# Patient Record
Sex: Female | Born: 1985
Health system: Southern US, Community
[De-identification: ages and names within clinical notes are randomized; demographics above are authoritative.]

## PROBLEM LIST (undated history)

## (undated) DIAGNOSIS — N200 Calculus of kidney: Secondary | ICD-10-CM

## (undated) DIAGNOSIS — R002 Palpitations: Secondary | ICD-10-CM

## (undated) DIAGNOSIS — F419 Anxiety disorder, unspecified: Secondary | ICD-10-CM

## (undated) DIAGNOSIS — E878 Other disorders of electrolyte and fluid balance, not elsewhere classified: Secondary | ICD-10-CM

## (undated) HISTORY — PX: WISDOM TOOTH EXTRACTION: SHX21

## (undated) HISTORY — DX: Palpitations: R00.2

## (undated) HISTORY — PX: LITHOTRIPSY: SUR834

---
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
## 2004-05-27 ENCOUNTER — Other Ambulatory Visit: Admission: RE | Admit: 2004-05-27 | Discharge: 2004-05-27 | Payer: Self-pay | Admitting: Obstetrics and Gynecology

## 2004-08-19 ENCOUNTER — Other Ambulatory Visit: Admission: RE | Admit: 2004-08-19 | Discharge: 2004-08-19 | Payer: Self-pay | Admitting: Obstetrics and Gynecology

## 2005-01-08 ENCOUNTER — Other Ambulatory Visit: Admission: RE | Admit: 2005-01-08 | Discharge: 2005-01-08 | Payer: Self-pay | Admitting: Obstetrics and Gynecology

## 2005-07-21 ENCOUNTER — Other Ambulatory Visit: Admission: RE | Admit: 2005-07-21 | Discharge: 2005-07-21 | Payer: Self-pay | Admitting: Obstetrics and Gynecology

## 2006-01-13 ENCOUNTER — Other Ambulatory Visit: Admission: RE | Admit: 2006-01-13 | Discharge: 2006-01-13 | Payer: Self-pay | Admitting: Obstetrics and Gynecology

## 2006-05-13 ENCOUNTER — Encounter: Admission: RE | Admit: 2006-05-13 | Discharge: 2006-05-13 | Payer: Self-pay | Admitting: Obstetrics and Gynecology

## 2008-03-20 ENCOUNTER — Emergency Department (HOSPITAL_COMMUNITY): Admission: EM | Admit: 2008-03-20 | Discharge: 2008-03-20 | Payer: Self-pay | Admitting: Emergency Medicine

## 2010-07-23 ENCOUNTER — Emergency Department (HOSPITAL_COMMUNITY): Admission: EM | Admit: 2010-07-23 | Discharge: 2010-07-23 | Payer: Self-pay | Admitting: Emergency Medicine

## 2010-07-31 ENCOUNTER — Ambulatory Visit (HOSPITAL_COMMUNITY): Admission: RE | Admit: 2010-07-31 | Discharge: 2010-07-31 | Payer: Self-pay | Admitting: Urology

## 2011-02-05 LAB — PREGNANCY, URINE: Preg Test, Ur: NEGATIVE

## 2011-02-06 LAB — DIFFERENTIAL
Basophils Relative: 1 % (ref 0–1)
Eosinophils Absolute: 0.2 10*3/uL (ref 0.0–0.7)
Lymphs Abs: 4.4 10*3/uL — ABNORMAL HIGH (ref 0.7–4.0)
Monocytes Absolute: 0.9 10*3/uL (ref 0.1–1.0)
Monocytes Relative: 5 % (ref 3–12)
Neutrophils Relative %: 68 % (ref 43–77)

## 2011-02-06 LAB — URINE MICROSCOPIC-ADD ON

## 2011-02-06 LAB — CBC
HCT: 39.5 % (ref 36.0–46.0)
Hemoglobin: 13.3 g/dL (ref 12.0–15.0)
MCH: 31.3 pg (ref 26.0–34.0)
MCHC: 33.7 g/dL (ref 30.0–36.0)
RBC: 4.25 MIL/uL (ref 3.87–5.11)

## 2011-02-06 LAB — COMPREHENSIVE METABOLIC PANEL
Alkaline Phosphatase: 65 U/L (ref 39–117)
BUN: 8 mg/dL (ref 6–23)
Chloride: 105 mEq/L (ref 96–112)
Creatinine, Ser: 0.83 mg/dL (ref 0.4–1.2)
Glucose, Bld: 103 mg/dL — ABNORMAL HIGH (ref 70–99)
Potassium: 3.4 mEq/L — ABNORMAL LOW (ref 3.5–5.1)
Total Bilirubin: 0.9 mg/dL (ref 0.3–1.2)

## 2011-02-06 LAB — URINALYSIS, ROUTINE W REFLEX MICROSCOPIC
Bilirubin Urine: NEGATIVE
Glucose, UA: NEGATIVE mg/dL
Ketones, ur: NEGATIVE mg/dL
Specific Gravity, Urine: 1.021 (ref 1.005–1.030)
pH: 5.5 (ref 5.0–8.0)

## 2011-02-06 LAB — LIPASE, BLOOD: Lipase: 21 U/L (ref 11–59)

## 2011-02-06 LAB — URINE CULTURE: Culture  Setup Time: 201108311353

## 2011-02-06 LAB — POCT I-STAT, CHEM 8
Calcium, Ion: 1.15 mmol/L (ref 1.12–1.32)
Chloride: 105 mEq/L (ref 96–112)
Creatinine, Ser: 0.8 mg/dL (ref 0.4–1.2)
Glucose, Bld: 106 mg/dL — ABNORMAL HIGH (ref 70–99)
Hemoglobin: 14.3 g/dL (ref 12.0–15.0)
Potassium: 3.5 mEq/L (ref 3.5–5.1)

## 2011-02-06 LAB — POCT PREGNANCY, URINE: Preg Test, Ur: NEGATIVE

## 2012-03-11 ENCOUNTER — Emergency Department (HOSPITAL_BASED_OUTPATIENT_CLINIC_OR_DEPARTMENT_OTHER)
Admission: EM | Admit: 2012-03-11 | Discharge: 2012-03-11 | Disposition: A | Discharge status: Home / Self Care | Payer: No Typology Code available for payment source | Attending: Emergency Medicine | Admitting: Emergency Medicine

## 2012-03-11 ENCOUNTER — Emergency Department (INDEPENDENT_AMBULATORY_CARE_PROVIDER_SITE_OTHER): Payer: No Typology Code available for payment source

## 2012-03-11 ENCOUNTER — Encounter (HOSPITAL_BASED_OUTPATIENT_CLINIC_OR_DEPARTMENT_OTHER): Payer: Self-pay | Admitting: *Deleted

## 2012-03-11 DIAGNOSIS — M542 Cervicalgia: Secondary | ICD-10-CM | POA: Insufficient documentation

## 2012-03-11 DIAGNOSIS — M25619 Stiffness of unspecified shoulder, not elsewhere classified: Secondary | ICD-10-CM | POA: Insufficient documentation

## 2012-03-11 DIAGNOSIS — S139XXA Sprain of joints and ligaments of unspecified parts of neck, initial encounter: Secondary | ICD-10-CM | POA: Insufficient documentation

## 2012-03-11 DIAGNOSIS — S161XXA Strain of muscle, fascia and tendon at neck level, initial encounter: Secondary | ICD-10-CM

## 2012-03-11 DIAGNOSIS — F17200 Nicotine dependence, unspecified, uncomplicated: Secondary | ICD-10-CM | POA: Insufficient documentation

## 2012-03-11 DIAGNOSIS — M25519 Pain in unspecified shoulder: Secondary | ICD-10-CM

## 2012-03-11 HISTORY — DX: Other disorders of electrolyte and fluid balance, not elsewhere classified: E87.8

## 2012-03-11 HISTORY — DX: Calculus of kidney: N20.0

## 2012-03-11 HISTORY — DX: Anxiety disorder, unspecified: F41.9

## 2012-03-11 MED ORDER — TRAMADOL HCL 50 MG PO TABS: 50.0000 mg | ORAL_TABLET | Freq: Four times a day (QID) | ORAL | Status: AC | PRN

## 2012-03-11 MED ORDER — ORPHENADRINE CITRATE ER 100 MG PO TB12: 100.0000 mg | ORAL_TABLET | Freq: Two times a day (BID) | ORAL | Status: AC
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
## 2014-10-26 ENCOUNTER — Emergency Department (HOSPITAL_COMMUNITY): Payer: PRIVATE HEALTH INSURANCE

## 2014-10-26 ENCOUNTER — Encounter (HOSPITAL_COMMUNITY): Payer: Self-pay | Admitting: Emergency Medicine

## 2014-10-26 ENCOUNTER — Emergency Department (HOSPITAL_COMMUNITY)
Admission: EM | Admit: 2014-10-26 | Discharge: 2014-10-26 | Disposition: A | Discharge status: Home / Self Care | Payer: PRIVATE HEALTH INSURANCE | Attending: Emergency Medicine | Admitting: Emergency Medicine

## 2014-10-26 DIAGNOSIS — W2209XA Striking against other stationary object, initial encounter: Secondary | ICD-10-CM | POA: Diagnosis not present

## 2014-10-26 DIAGNOSIS — Y9389 Activity, other specified: Secondary | ICD-10-CM | POA: Diagnosis not present

## 2014-10-26 DIAGNOSIS — S90121A Contusion of right lesser toe(s) without damage to nail, initial encounter: Secondary | ICD-10-CM | POA: Diagnosis not present

## 2014-10-26 DIAGNOSIS — Z8639 Personal history of other endocrine, nutritional and metabolic disease: Secondary | ICD-10-CM | POA: Diagnosis not present

## 2014-10-26 DIAGNOSIS — S99921A Unspecified injury of right foot, initial encounter: Secondary | ICD-10-CM | POA: Diagnosis present

## 2014-10-26 DIAGNOSIS — Z87442 Personal history of urinary calculi: Secondary | ICD-10-CM | POA: Diagnosis not present

## 2014-10-26 DIAGNOSIS — Z720 Tobacco use: Secondary | ICD-10-CM | POA: Diagnosis not present

## 2014-10-26 DIAGNOSIS — Z79899 Other long term (current) drug therapy: Secondary | ICD-10-CM | POA: Insufficient documentation

## 2014-10-26 DIAGNOSIS — G43909 Migraine, unspecified, not intractable, without status migrainosus: Secondary | ICD-10-CM | POA: Insufficient documentation

## 2014-10-26 DIAGNOSIS — Y9289 Other specified places as the place of occurrence of the external cause: Secondary | ICD-10-CM | POA: Diagnosis not present

## 2014-10-26 DIAGNOSIS — M79671 Pain in right foot: Secondary | ICD-10-CM

## 2014-10-26 DIAGNOSIS — Y998 Other external cause status: Secondary | ICD-10-CM | POA: Diagnosis not present

## 2014-10-26 DIAGNOSIS — F419 Anxiety disorder, unspecified: Secondary | ICD-10-CM | POA: Diagnosis not present
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
## 2014-10-26 NOTE — Discharge Instructions (Signed)

## 2014-10-26 NOTE — ED Provider Notes (Signed)
CSN: [id number]     Arrival date & time 10/26/14  1715 History   First MD Initiated Contact with Patient 10/26/14 1722     Chief Complaint  Patient presents with  . Foot Pain     (Consider location/radiation/quality/duration/timing/severity/associated sxs/prior Treatment) Patient is a 28 y.o. female presenting with lower extremity pain. The history is provided by the patient. No language interpreter was used.  Foot Pain This is a new problem. The current episode started today. The problem occurs constantly. The problem has been unchanged. Associated symptoms include arthralgias. The symptoms are aggravated by walking. She has tried nothing for the symptoms.    Past Medical History  Diagnosis Date  . Kidney stone   . Anxiety   . Migraine   . Hyperchloremia    Past Surgical History  Procedure Laterality Date  . Wisdom tooth extraction    . Lithotripsy     No family history on file. History  Substance Use Topics  . Smoking status: Current Every Day Smoker -- 0.50 packs/day    Types: Cigarettes  . Smokeless tobacco: Not on file  . Alcohol Use: No     Comment: rarely   OB History    No data available     Review of Systems  Musculoskeletal: Positive for arthralgias.  All other systems reviewed and are negative.     Allergies  Bactrim  Home Medications   Prior to Admission medications   Medication Sig Start Date End Date Taking? Authorizing Provider  ALPRAZolam Prudy Feeler(XANAX) 0.5 MG tablet Take 0.5 mg by mouth at bedtime as needed.    Historical Provider, MD  aspirin-acetaminophen-caffeine (EXCEDRIN MIGRAINE) [phone] [id number] MG per tablet Take 1 tablet by mouth every 6 (six) hours as needed.    Historical Provider, MD  Multiple Vitamin (MULTIVITAMIN) capsule Take 1 capsule by mouth daily.    Historical Provider, MD  norethindrone-ethinyl estradiol (JUNEL FE,GILDESS FE,LOESTRIN FE) 1-20 MG-MCG tablet Take 1 tablet by mouth daily.    Historical Provider, MD   LMP 10/04/2014  (Exact Date) Physical Exam  Constitutional: She is oriented to person, place, and time. She appears well-developed and well-nourished.  HENT:  Head: Normocephalic.  Eyes: Pupils are equal, round, and reactive to light.  Neck: Neck supple.  Cardiovascular: Normal rate and regular rhythm.   Pulmonary/Chest: Effort normal and breath sounds normal.  Musculoskeletal: She exhibits tenderness.  Neurological: She is alert and oriented to person, place, and time.  Skin: Skin is warm and dry.  Psychiatric: She has a normal mood and affect.  Nursing note and vitals reviewed.   ED Course  Procedures (including critical care time) Labs Review Labs Reviewed - No data to display  Imaging Review No results found.   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  Bariatric bed ran over the toes of her right foot.  Patient self-extricated from under bed, leaving shoe and sock behind.  Currently having pain to the dorsal aspect of her 2nd and 3rd toes.  No deformity or obvious external bruising noted. MDM   Final diagnoses:  Foot pain, right    Contusion to 2nd and 3rd toes of right foot.    Jimmye Normanavid John Darthula Desa, NP 10/26/14 2111  Gerhard Munchobert Lockwood, MD 10/26/14 [phone]

## 2015-08-07 ENCOUNTER — Other Ambulatory Visit: Payer: Self-pay | Admitting: Internal Medicine

## 2015-08-07 ENCOUNTER — Other Ambulatory Visit (HOSPITAL_COMMUNITY): Payer: Self-pay | Admitting: Internal Medicine

## 2015-08-07 DIAGNOSIS — R7989 Other specified abnormal findings of blood chemistry: Secondary | ICD-10-CM

## 2015-08-07 DIAGNOSIS — R000 Tachycardia, unspecified: Secondary | ICD-10-CM

## 2015-08-07 DIAGNOSIS — R0600 Dyspnea, unspecified: Secondary | ICD-10-CM

## 2015-08-07 DIAGNOSIS — R0789 Other chest pain: Secondary | ICD-10-CM

## 2015-08-08 ENCOUNTER — Ambulatory Visit (HOSPITAL_COMMUNITY)
Admission: RE | Admit: 2015-08-08 | Discharge: 2015-08-08 | Disposition: A | Discharge status: Home / Self Care | Payer: 59 | Source: Ambulatory Visit | Attending: Internal Medicine | Admitting: Internal Medicine

## 2015-08-08 ENCOUNTER — Encounter (HOSPITAL_COMMUNITY): Payer: Self-pay

## 2015-08-08 DIAGNOSIS — R0602 Shortness of breath: Secondary | ICD-10-CM | POA: Insufficient documentation

## 2015-08-08 DIAGNOSIS — R7989 Other specified abnormal findings of blood chemistry: Secondary | ICD-10-CM

## 2015-08-08 DIAGNOSIS — R000 Tachycardia, unspecified: Secondary | ICD-10-CM | POA: Diagnosis not present

## 2015-08-08 DIAGNOSIS — R0600 Dyspnea, unspecified: Secondary | ICD-10-CM

## 2015-08-08 DIAGNOSIS — R0789 Other chest pain: Secondary | ICD-10-CM | POA: Insufficient documentation

## 2015-08-08 MED ORDER — IOHEXOL 350 MG/ML SOLN
100.0000 mL | Freq: Once | INTRAVENOUS | Status: AC | PRN
  Administered 2015-08-08: 100 mL via INTRAVENOUS

## 2015-08-30 ENCOUNTER — Telehealth: Payer: Self-pay | Admitting: Cardiovascular Disease

## 2015-08-30 NOTE — Telephone Encounter (Signed)
ERROR

## 2015-09-03 ENCOUNTER — Ambulatory Visit (INDEPENDENT_AMBULATORY_CARE_PROVIDER_SITE_OTHER): Payer: 59 | Admitting: Cardiovascular Disease

## 2015-09-03 ENCOUNTER — Encounter: Payer: Self-pay | Admitting: Cardiovascular Disease

## 2015-09-03 VITALS — BP 110/58 | HR 88 | Ht 65.0 in | Wt 130.8 lb

## 2015-09-03 DIAGNOSIS — R002 Palpitations: Secondary | ICD-10-CM | POA: Diagnosis not present

## 2015-09-03 DIAGNOSIS — R0602 Shortness of breath: Secondary | ICD-10-CM

## 2015-09-03 HISTORY — DX: Palpitations: R00.2

## 2015-09-03 NOTE — Progress Notes (Addendum)
Cardiology Office Note Date:  10/23/2016   ID:  Elizabeth Young, DOB Feb 12, 1986, MRN [id number]  PCP:  Pearla Dubonnet, MD  Cardiologist:  Tonny Bollman, MD    Chief Complaint  Patient presents with  . Palpitations    History of Present Illness: Elizabeth Young is a 29 y.o. female who presents for evaluation of shortness of breath. She has a long hx of heart palpitations, but these have been more frequent lately. Also has had shortness of breath, but feels like she's had muscle tightness in the chest wall, neck, and back. Feels like she 'can't get a deep breath.' Has had some right-sided chest pain into the abdomen. No exertional symptoms. In fact, states she feels better when she walks or does physical activity.   She's been under a lot of stress and admits to significant anxiety. She is undergoing fertility treatment and currently is taking Clomid.   She has no personal history or CAD or cardiac problems, nor is there a family history of CAD. She has undergone a CTA of the chest to evaluate for PE and this was normal.      Past Medical History:  Diagnosis Date  . Anxiety   . Heart palpitations 09/03/2015  . Hyperchloremia   . Kidney stone   . Migraine     Past Surgical History:  Procedure Laterality Date  . LITHOTRIPSY    . WISDOM TOOTH EXTRACTION      Current Outpatient Prescriptions  Medication Sig Dispense Refill  . cetirizine (ZYRTEC) 10 MG tablet Take 10 mg by mouth as needed for allergies.    Marland Kitchen aspirin-acetaminophen-caffeine (EXCEDRIN MIGRAINE) 250-250-65 MG per tablet Take 1 tablet by mouth every 6 (six) hours as needed.    Marland Kitchen LORazepam (ATIVAN) 0.5 MG tablet Take 0.25 mg by mouth every 12 (twelve) hours as needed.   1  . Multiple Vitamin (MULTIVITAMIN) capsule Take 1 capsule by mouth daily.     No current facility-administered medications for this visit.     Allergies:   Bactrim [sulfamethoxazole-trimethoprim]   Social History:  The patient  reports  that she has quit smoking. Her smoking use included Cigarettes. She has never used smokeless tobacco. She reports that she does not drink alcohol or use drugs.   Family History:  The patient's family history includes Heart attack in her maternal grandfather and paternal grandmother.    ROS:  Please see the history of present illness.  Otherwise, review of systems is positive for cough, back pain, muscle pain, palpitations.  All other systems are reviewed and negative.    PHYSICAL EXAM: VS:  BP (!) 110/58   Pulse 88   Ht  (1.651 m)   Wt 130 lb 12.8 oz (59.3 kg)   LMP 08/03/2015   BMI 21.77 kg/m  , BMI Body mass index is 21.77 kg/m. GEN: Well nourished, well developed, in no acute distress HEENT: normal Neck: no JVD, no masses. No carotid bruits Cardiac: RRR without murmur or gallop                Respiratory:  clear to auscultation bilaterally, normal work of breathing GI: soft, nontender, nondistended, + BS MS: no deformity or atrophy Ext: no pretibial edema, pedal pulses 2+= bilaterally Skin: warm and dry, no rash Neuro:  Strength and sensation are intact Psych: euthymic mood, full affect  EKG:  EKG is not ordered today. The ekg from 08-07-2015 shows NSR, within normal limits  Recent Labs: No results found  for requested labs within last 8760 hours.   Lipid Panel  No results found for: CHOL, TRIG, HDL, CHOLHDL, VLDL, LDLCALC, LDLDIRECT    Wt Readings from Last 3 Encounters:  09/03/15 130 lb 12.8 oz (59.3 kg)  03/11/12 137 lb (62.1 kg)     ASSESSMENT AND PLAN: 29 yo woman with palpitations, shortness of breath, and atypical chest pain. I've recommended a 2D echo and Holter monitor to evaluate for any evidence of structural heart abnormality or rhythm disturbance. If these studies show no significant abnormalities, I would not be inclined for any further evaluation at this time. Will touch base with Jacinta once her studies are completed. She was reassured about her  cardiac exam and normal studies to date (CT scan, EKG).    Current medicines are reviewed with the patient today.  The patient does not have concerns regarding medicines.  Labs/ tests ordered today include:   Orders Placed This Encounter  Procedures  . Holter monitor - 24 hour  . Echocardiogram    Disposition:   FU as needed  Signed, Tonny Bollman, MD  10/23/2016 9:07 AM    [facility] [address] [place], [place], [place]  [address] Phone: [phone]; Fax: [phone]

## 2015-09-03 NOTE — Patient Instructions (Addendum)
Medication Instructions:  Your physician recommends that you continue on your current medications as directed. Please refer to the Current Medication list given to you today.  Labwork: No new orders.   Testing/Procedures: Your physician has requested that you have an echocardiogram. Echocardiography is a painless test that uses sound waves to create images of your heart. It provides your doctor with information about the size and shape of your heart and how well your heart's chambers and valves are working. This procedure takes approximately one hour. There are no restrictions for this procedure.  Your physician has recommended that you wear a 24 hour holter monitor. Holter monitors are medical devices that record the heart's electrical activity. Doctors most often use these monitors to diagnose arrhythmias. Arrhythmias are problems with the speed or rhythm of the heartbeat. The monitor is a small, portable device. You can wear one while you do your normal daily activities. This is usually used to diagnose what is causing palpitations/syncope (passing out).  Follow-Up: Your physician recommends that you schedule a follow-up appointment as needed with Dr Cooper.    Any Other Special Instructions Will Be Listed Below (If Applicable).   

## 2015-09-12 ENCOUNTER — Ambulatory Visit (HOSPITAL_COMMUNITY): Payer: 59 | Attending: Cardiology

## 2015-09-12 ENCOUNTER — Other Ambulatory Visit: Payer: Self-pay

## 2015-09-12 ENCOUNTER — Ambulatory Visit (INDEPENDENT_AMBULATORY_CARE_PROVIDER_SITE_OTHER): Payer: 59

## 2015-09-12 DIAGNOSIS — R0602 Shortness of breath: Secondary | ICD-10-CM | POA: Diagnosis not present

## 2015-09-12 DIAGNOSIS — R002 Palpitations: Secondary | ICD-10-CM | POA: Insufficient documentation

## 2015-09-12 DIAGNOSIS — I340 Nonrheumatic mitral (valve) insufficiency: Secondary | ICD-10-CM | POA: Diagnosis not present

## 2015-12-05 DIAGNOSIS — N979 Female infertility, unspecified: Secondary | ICD-10-CM | POA: Diagnosis not present

## 2015-12-05 MED FILL — OVIDREL 250 MCG/0.5 ML SYRG: 250 | 1 days supply | Qty: 1 | Fill #0

## 2015-12-06 DIAGNOSIS — N979 Female infertility, unspecified: Secondary | ICD-10-CM | POA: Diagnosis not present

## 2015-12-18 MED FILL — SERTRALINE HCL 50 MG TABLET: 50 | 30 days supply | Qty: 30 | Fill #1

## 2015-12-24 DIAGNOSIS — N912 Amenorrhea, unspecified: Secondary | ICD-10-CM | POA: Diagnosis not present

## 2016-01-20 MED FILL — CLOMIPHENE CITRATE 50 MG TA: 50 | 5 days supply | Qty: 10 | Fill #0

## 2016-01-23 MED FILL — SERTRALINE HCL 50 MG TABLET: 50 | 30 days supply | Qty: 30 | Fill #2

## 2016-01-30 DIAGNOSIS — N979 Female infertility, unspecified: Secondary | ICD-10-CM | POA: Diagnosis not present

## 2016-01-30 MED FILL — OVIDREL 250 MCG/0.5 ML SYRG: 250 | 1 days supply | Qty: 1 | Fill #0

## 2016-01-31 DIAGNOSIS — N979 Female infertility, unspecified: Secondary | ICD-10-CM | POA: Diagnosis not present

## 2016-02-18 MED FILL — SERTRALINE HCL 50 MG TABLET: 50 | 30 days supply | Qty: 30 | Fill #3

## 2016-02-25 DIAGNOSIS — N979 Female infertility, unspecified: Secondary | ICD-10-CM | POA: Diagnosis not present

## 2016-03-27 MED FILL — SERTRALINE HCL 50 MG TABLET: 50 | 30 days supply | Qty: 30 | Fill #4

## 2016-04-30 MED FILL — SERTRALINE HCL 50 MG TABLET: 50 | 30 days supply | Qty: 30 | Fill #5

## 2016-05-29 MED FILL — SERTRALINE HCL 50 MG TABLET: 50 | 30 days supply | Qty: 30 | Fill #0

## 2016-07-02 MED FILL — SERTRALINE HCL 50 MG TABLET: 50 | 30 days supply | Qty: 30 | Fill #1

## 2016-08-06 MED FILL — SERTRALINE HCL 50 MG TABLET: 50 | 30 days supply | Qty: 30 | Fill #2

## 2016-08-24 DIAGNOSIS — N911 Secondary amenorrhea: Secondary | ICD-10-CM | POA: Diagnosis not present

## 2016-08-27 ENCOUNTER — Telehealth: Payer: Self-pay | Admitting: Cardiovascular Disease

## 2016-08-27 NOTE — Telephone Encounter (Signed)
Received Amendment request from patient on August 27, 2016. Documents forwarded to Dr. Thad Rangerooper DAJ

## 2016-09-01 DIAGNOSIS — Z3A08 8 weeks gestation of pregnancy: Secondary | ICD-10-CM | POA: Diagnosis not present

## 2016-09-01 DIAGNOSIS — Z3401 Encounter for supervision of normal first pregnancy, first trimester: Secondary | ICD-10-CM | POA: Diagnosis not present

## 2016-09-01 DIAGNOSIS — O26841 Uterine size-date discrepancy, first trimester: Secondary | ICD-10-CM | POA: Diagnosis not present

## 2016-09-01 LAB — OB RESULTS CONSOLE RPR: RPR: NONREACTIVE

## 2016-09-01 LAB — OB RESULTS CONSOLE HIV ANTIBODY (ROUTINE TESTING): HIV: NONREACTIVE

## 2016-09-01 LAB — OB RESULTS CONSOLE HEPATITIS B SURFACE ANTIGEN: Hepatitis B Surface Ag: NEGATIVE

## 2016-09-01 LAB — OB RESULTS CONSOLE RUBELLA ANTIBODY, IGM: RUBELLA: IMMUNE

## 2016-09-01 LAB — OB RESULTS CONSOLE ANTIBODY SCREEN: Antibody Screen: NEGATIVE

## 2016-09-01 LAB — OB RESULTS CONSOLE ABO/RH: RH Type: POSITIVE

## 2016-09-07 MED FILL — SERTRALINE HCL 50 MG TABLET: 50 | 30 days supply | Qty: 30 | Fill #3

## 2016-09-21 DIAGNOSIS — Z3480 Encounter for supervision of other normal pregnancy, unspecified trimester: Secondary | ICD-10-CM | POA: Diagnosis not present

## 2016-09-21 DIAGNOSIS — Z3401 Encounter for supervision of normal first pregnancy, first trimester: Secondary | ICD-10-CM | POA: Diagnosis not present

## 2016-09-21 DIAGNOSIS — Z113 Encounter for screening for infections with a predominantly sexual mode of transmission: Secondary | ICD-10-CM | POA: Diagnosis not present

## 2016-09-21 LAB — OB RESULTS CONSOLE GC/CHLAMYDIA
CHLAMYDIA, DNA PROBE: NEGATIVE
GC PROBE AMP, GENITAL: NEGATIVE

## 2016-10-01 DIAGNOSIS — Z36 Encounter for antenatal screening for chromosomal anomalies: Secondary | ICD-10-CM | POA: Diagnosis not present

## 2016-10-01 DIAGNOSIS — Z3491 Encounter for supervision of normal pregnancy, unspecified, first trimester: Secondary | ICD-10-CM | POA: Diagnosis not present

## 2016-10-01 DIAGNOSIS — Z3682 Encounter for antenatal screening for nuchal translucency: Secondary | ICD-10-CM | POA: Diagnosis not present

## 2016-10-22 MED FILL — SERTRALINE HCL 50 MG TABLET: 50 | 30 days supply | Qty: 30 | Fill #4

## 2016-10-23 ENCOUNTER — Encounter: Payer: Self-pay | Admitting: Cardiovascular Disease

## 2016-11-09 DIAGNOSIS — Z3400 Encounter for supervision of normal first pregnancy, unspecified trimester: Secondary | ICD-10-CM | POA: Diagnosis not present

## 2016-11-09 DIAGNOSIS — Z3A08 8 weeks gestation of pregnancy: Secondary | ICD-10-CM | POA: Diagnosis not present

## 2016-11-09 DIAGNOSIS — Z3A18 18 weeks gestation of pregnancy: Secondary | ICD-10-CM | POA: Diagnosis not present

## 2016-11-09 DIAGNOSIS — Z363 Encounter for antenatal screening for malformations: Secondary | ICD-10-CM | POA: Diagnosis not present

## 2016-12-01 MED FILL — SERTRALINE HCL 50 MG TABLET: 50 | 30 days supply | Qty: 30 | Fill #5

## 2016-12-30 MED FILL — SERTRALINE HCL 50 MG TABLET: 50 | 30 days supply | Qty: 30 | Fill #0

## 2017-01-20 DIAGNOSIS — Z3480 Encounter for supervision of other normal pregnancy, unspecified trimester: Secondary | ICD-10-CM | POA: Diagnosis not present

## 2017-01-26 DIAGNOSIS — O99810 Abnormal glucose complicating pregnancy: Secondary | ICD-10-CM | POA: Diagnosis not present

## 2017-01-29 MED FILL — SERTRALINE HCL 50 MG TABLET: 50 | 30 days supply | Qty: 30 | Fill #1 | Status: TO

## 2017-02-02 DIAGNOSIS — O26873 Cervical shortening, third trimester: Secondary | ICD-10-CM | POA: Diagnosis not present

## 2017-02-02 DIAGNOSIS — Z36 Encounter for antenatal screening for chromosomal anomalies: Secondary | ICD-10-CM | POA: Diagnosis not present

## 2017-02-02 DIAGNOSIS — Z3400 Encounter for supervision of normal first pregnancy, unspecified trimester: Secondary | ICD-10-CM | POA: Diagnosis not present

## 2017-02-02 DIAGNOSIS — Z3A30 30 weeks gestation of pregnancy: Secondary | ICD-10-CM | POA: Diagnosis not present

## 2017-02-17 MED FILL — raNITIdine HCL 150 MG TABS: 150 | 30 days supply | Qty: 60 | Fill #0

## 2017-02-19 MED FILL — FERRALET 90 DUAL-IRON TAB: 90-1 | 30 days supply | Qty: 30 | Fill #0

## 2017-03-17 DIAGNOSIS — Z3400 Encounter for supervision of normal first pregnancy, unspecified trimester: Secondary | ICD-10-CM | POA: Diagnosis not present

## 2017-03-17 DIAGNOSIS — Z3688 Encounter for antenatal screening for fetal macrosomia: Secondary | ICD-10-CM | POA: Diagnosis not present

## 2017-03-17 DIAGNOSIS — Z3685 Encounter for antenatal screening for Streptococcus B: Secondary | ICD-10-CM | POA: Diagnosis not present

## 2017-03-17 LAB — OB RESULTS CONSOLE GBS: GBS: NEGATIVE

## 2017-04-08 ENCOUNTER — Encounter (HOSPITAL_COMMUNITY): Payer: Self-pay | Admitting: *Deleted

## 2017-04-08 ENCOUNTER — Inpatient Hospital Stay (HOSPITAL_COMMUNITY)
Admission: AD | Admit: 2017-04-08 | Discharge: 2017-04-11 | DRG: 766 | Disposition: A | Discharge status: Home / Self Care | Payer: 59 | Source: Ambulatory Visit | Attending: Obstetrics and Gynecology | Admitting: Obstetrics and Gynecology

## 2017-04-08 DIAGNOSIS — Z3A39 39 weeks gestation of pregnancy: Secondary | ICD-10-CM | POA: Diagnosis not present

## 2017-04-08 DIAGNOSIS — O3663X0 Maternal care for excessive fetal growth, third trimester, not applicable or unspecified: Principal | ICD-10-CM | POA: Diagnosis present

## 2017-04-08 DIAGNOSIS — O34219 Maternal care for unspecified type scar from previous cesarean delivery: Secondary | ICD-10-CM | POA: Diagnosis not present

## 2017-04-08 DIAGNOSIS — Z98891 History of uterine scar from previous surgery: Secondary | ICD-10-CM

## 2017-04-08 DIAGNOSIS — Z3490 Encounter for supervision of normal pregnancy, unspecified, unspecified trimester: Secondary | ICD-10-CM

## 2017-04-08 DIAGNOSIS — Z3493 Encounter for supervision of normal pregnancy, unspecified, third trimester: Secondary | ICD-10-CM | POA: Diagnosis present

## 2017-04-08 LAB — CBC
HEMATOCRIT: 34.1 % — AB (ref 36.0–46.0)
Hemoglobin: 11.7 g/dL — ABNORMAL LOW (ref 12.0–15.0)
MCH: 31.5 pg (ref 26.0–34.0)
MCHC: 34.3 g/dL (ref 30.0–36.0)
MCV: 91.7 fL (ref 78.0–100.0)
PLATELETS: 163 10*3/uL (ref 150–400)
RBC: 3.72 MIL/uL — AB (ref 3.87–5.11)
RDW: 13.9 % (ref 11.5–15.5)
WBC: 14.3 10*3/uL — AB (ref 4.0–10.5)

## 2017-04-08 LAB — TYPE AND SCREEN
ABO/RH(D): O POS
Antibody Screen: NEGATIVE

## 2017-04-08 LAB — ABO/RH: ABO/RH(D): O POS

## 2017-04-08 MED ORDER — FENTANYL 2.5 MCG/ML BUPIVACAINE 1/10 % EPIDURAL INFUSION (WH - ANES)
14.0000 mL/h | INTRAMUSCULAR | Status: DC | PRN
  Administered 2017-04-09 (×3): 14 mL/h via EPIDURAL
  Filled 2017-04-08 (×2): qty 100

## 2017-04-08 MED ORDER — OXYTOCIN BOLUS FROM INFUSION: 500.0000 mL | Freq: Once | INTRAVENOUS | Status: DC

## 2017-04-08 MED ORDER — OXYCODONE-ACETAMINOPHEN 5-325 MG PO TABS: 2.0000 | ORAL_TABLET | ORAL | Status: DC | PRN

## 2017-04-08 MED ORDER — ONDANSETRON HCL 4 MG/2ML IJ SOLN: 4.0000 mg | Freq: Four times a day (QID) | INTRAMUSCULAR | Status: DC | PRN

## 2017-04-08 MED ORDER — ACETAMINOPHEN 325 MG PO TABS: 650.0000 mg | ORAL_TABLET | ORAL | Status: DC | PRN

## 2017-04-08 MED ORDER — EPHEDRINE 5 MG/ML INJ: 10.0000 mg | INTRAVENOUS | Status: DC | PRN

## 2017-04-08 MED ORDER — PHENYLEPHRINE 40 MCG/ML (10ML) SYRINGE FOR IV PUSH (FOR BLOOD PRESSURE SUPPORT): 80.0000 ug | PREFILLED_SYRINGE | INTRAVENOUS | Status: DC | PRN

## 2017-04-08 MED ORDER — LACTATED RINGERS IV SOLN
500.0000 mL | INTRAVENOUS | Status: DC | PRN
  Administered 2017-04-09 (×3): via INTRAVENOUS

## 2017-04-08 MED ORDER — OXYTOCIN 40 UNITS IN LACTATED RINGERS INFUSION - SIMPLE MED: 2.5000 [IU]/h | INTRAVENOUS | Status: DC

## 2017-04-08 MED ORDER — TERBUTALINE SULFATE 1 MG/ML IJ SOLN: 0.2500 mg | Freq: Once | INTRAMUSCULAR | Status: DC | PRN

## 2017-04-08 MED ORDER — OXYCODONE-ACETAMINOPHEN 5-325 MG PO TABS: 1.0000 | ORAL_TABLET | ORAL | Status: DC | PRN

## 2017-04-08 MED ORDER — DIPHENHYDRAMINE HCL 50 MG/ML IJ SOLN
12.5000 mg | INTRAMUSCULAR | Status: DC | PRN
  Administered 2017-04-09: 12.5 mg via INTRAVENOUS
  Filled 2017-04-08: qty 1

## 2017-04-08 MED ORDER — OXYTOCIN 40 UNITS IN LACTATED RINGERS INFUSION - SIMPLE MED
1.0000 m[IU]/min | INTRAVENOUS | Status: DC
  Administered 2017-04-08: 1 m[IU]/min via INTRAVENOUS
  Filled 2017-04-08: qty 1000

## 2017-04-08 MED ORDER — LACTATED RINGERS IV SOLN
500.0000 mL | Freq: Once | INTRAVENOUS | Status: AC
  Administered 2017-04-09: 500 mL via INTRAVENOUS

## 2017-04-08 MED ORDER — SOD CITRATE-CITRIC ACID 500-334 MG/5ML PO SOLN
30.0000 mL | ORAL | Status: DC | PRN
  Administered 2017-04-09: 30 mL via ORAL
  Filled 2017-04-08: qty 15

## 2017-04-08 MED ORDER — LACTATED RINGERS IV SOLN
INTRAVENOUS | Status: DC
  Administered 2017-04-08 – 2017-04-09 (×4): via INTRAVENOUS

## 2017-04-08 MED ORDER — PHENYLEPHRINE 40 MCG/ML (10ML) SYRINGE FOR IV PUSH (FOR BLOOD PRESSURE SUPPORT)
80.0000 ug | PREFILLED_SYRINGE | INTRAVENOUS | Status: DC | PRN
  Filled 2017-04-08: qty 10

## 2017-04-08 MED ORDER — LIDOCAINE HCL (PF) 1 % IJ SOLN
30.0000 mL | INTRAMUSCULAR | Status: DC | PRN
  Filled 2017-04-08: qty 30

## 2017-04-08 NOTE — H&P (Signed)
Elizabeth Young  DICTATION # [id number] CSN# [id number]   Elizabeth Young,Elizabeth Isip M, MD 04/08/2017 4:08 PM

## 2017-04-08 NOTE — Anesthesia Pain Management Evaluation Note (Signed)
  CRNA Pain Management Visit Note  Patient: Elizabeth Young, 31 y.o., female  "Hello I am a [facility] of the anesthesia [facility] at [facility]. We have an anesthesia team available at all times to provide care throughout the hospital, including epidural management and anesthesia for C-section. I don't know your plan for the delivery whether it a natural birth, water birth, IV sedation, nitrous supplementation, doula or epidural, but we want to meet your pain goals."   1.Was your pain managed to your expectations on prior hospitalizations?   No prior hospitalizations  2.What is your expectation for pain management during this hospitalization?     Epidural, IV pain meds and Nitrous Oxide  3.How can we help you reach that goal? Open to discussion about all methods of pain control, but will probably choose an epidural  Record the patient's initial score and the patient's pain goal.   Pain: 3  Pain Goal: 6 [facility] wants you to be able to say your pain was always managed very well.  Elizabeth Young 04/08/2017

## 2017-04-09 ENCOUNTER — Inpatient Hospital Stay (HOSPITAL_COMMUNITY): Payer: 59 | Admitting: Anesthesiology

## 2017-04-09 ENCOUNTER — Encounter (HOSPITAL_COMMUNITY): Payer: Self-pay | Admitting: *Deleted

## 2017-04-09 ENCOUNTER — Encounter (HOSPITAL_COMMUNITY)
Admission: AD | Disposition: A | Discharge status: Home / Self Care | Payer: Self-pay | Source: Ambulatory Visit | Attending: Obstetrics and Gynecology

## 2017-04-09 DIAGNOSIS — Z98891 History of uterine scar from previous surgery: Secondary | ICD-10-CM

## 2017-04-09 LAB — RPR: RPR Ser Ql: NONREACTIVE

## 2017-04-09 SURGERY — Surgical Case
Anesthesia: Epidural

## 2017-04-09 MED ORDER — NALBUPHINE HCL 10 MG/ML IJ SOLN: 5.0000 mg | INTRAMUSCULAR | Status: DC | PRN

## 2017-04-09 MED ORDER — HYDROMORPHONE HCL 1 MG/ML IJ SOLN
INTRAMUSCULAR | Status: AC
  Filled 2017-04-09: qty 1

## 2017-04-09 MED ORDER — SIMETHICONE 80 MG PO CHEW
80.0000 mg | CHEWABLE_TABLET | ORAL | Status: DC | PRN
  Administered 2017-04-11: 80 mg via ORAL

## 2017-04-09 MED ORDER — OXYCODONE-ACETAMINOPHEN 5-325 MG PO TABS
1.0000 | ORAL_TABLET | ORAL | Status: DC | PRN
  Administered 2017-04-10: 1 via ORAL
  Filled 2017-04-09: qty 1

## 2017-04-09 MED ORDER — MENTHOL 3 MG MT LOZG: 1.0000 | LOZENGE | OROMUCOSAL | Status: DC | PRN

## 2017-04-09 MED ORDER — KETOROLAC TROMETHAMINE 30 MG/ML IJ SOLN
30.0000 mg | Freq: Four times a day (QID) | INTRAMUSCULAR | Status: DC | PRN
  Administered 2017-04-09: 30 mg via INTRAMUSCULAR

## 2017-04-09 MED ORDER — SODIUM BICARBONATE 8.4 % IV SOLN
INTRAVENOUS | Status: DC | PRN
  Administered 2017-04-09 (×3): 5 mL via EPIDURAL

## 2017-04-09 MED ORDER — NALBUPHINE HCL 10 MG/ML IJ SOLN: 5.0000 mg | Freq: Once | INTRAMUSCULAR | Status: DC | PRN

## 2017-04-09 MED ORDER — LACTATED RINGERS IV SOLN: INTRAVENOUS | Status: DC

## 2017-04-09 MED ORDER — ONDANSETRON HCL 4 MG/2ML IJ SOLN
INTRAMUSCULAR | Status: AC
  Filled 2017-04-09: qty 4

## 2017-04-09 MED ORDER — IBUPROFEN 600 MG PO TABS
600.0000 mg | ORAL_TABLET | Freq: Four times a day (QID) | ORAL | Status: DC
  Administered 2017-04-09 – 2017-04-11 (×6): 600 mg via ORAL
  Filled 2017-04-09 (×7): qty 1

## 2017-04-09 MED ORDER — KETOROLAC TROMETHAMINE 30 MG/ML IJ SOLN: 30.0000 mg | Freq: Four times a day (QID) | INTRAMUSCULAR | Status: DC | PRN

## 2017-04-09 MED ORDER — ONDANSETRON HCL 4 MG/2ML IJ SOLN: 4.0000 mg | Freq: Three times a day (TID) | INTRAMUSCULAR | Status: DC | PRN

## 2017-04-09 MED ORDER — LIDOCAINE-EPINEPHRINE (PF) 2 %-1:200000 IJ SOLN
INTRAMUSCULAR | Status: AC
Start: 2017-04-09 — End: 2017-04-09
  Filled 2017-04-09: qty 20

## 2017-04-09 MED ORDER — FENTANYL CITRATE (PF) 100 MCG/2ML IJ SOLN
25.0000 ug | INTRAMUSCULAR | Status: DC | PRN
Start: 2017-04-09 — End: 2017-04-09
  Administered 2017-04-09: 25 ug via INTRAVENOUS
  Administered 2017-04-09: 50 ug via INTRAVENOUS
  Administered 2017-04-09: 25 ug via INTRAVENOUS

## 2017-04-09 MED ORDER — ACETAMINOPHEN 325 MG PO TABS
650.0000 mg | ORAL_TABLET | ORAL | Status: DC | PRN
  Administered 2017-04-09: 650 mg via ORAL
  Filled 2017-04-09: qty 2

## 2017-04-09 MED ORDER — MEPERIDINE HCL 25 MG/ML IJ SOLN: 6.2500 mg | INTRAMUSCULAR | Status: DC | PRN

## 2017-04-09 MED ORDER — HYDROMORPHONE HCL 1 MG/ML IJ SOLN
INTRAMUSCULAR | Status: DC | PRN
  Administered 2017-04-09: 1 mg via INTRAVENOUS

## 2017-04-09 MED ORDER — NALOXONE HCL 0.4 MG/ML IJ SOLN: 0.4000 mg | INTRAMUSCULAR | Status: DC | PRN

## 2017-04-09 MED ORDER — OXYTOCIN 10 UNIT/ML IJ SOLN
INTRAMUSCULAR | Status: AC
  Filled 2017-04-09: qty 4

## 2017-04-09 MED ORDER — OXYCODONE-ACETAMINOPHEN 5-325 MG PO TABS: 2.0000 | ORAL_TABLET | ORAL | Status: DC | PRN

## 2017-04-09 MED ORDER — OXYTOCIN 10 UNIT/ML IJ SOLN
INTRAVENOUS | Status: DC | PRN
  Administered 2017-04-09: 40 [IU] via INTRAVENOUS

## 2017-04-09 MED ORDER — MORPHINE SULFATE (PF) 0.5 MG/ML IJ SOLN
INTRAMUSCULAR | Status: AC
  Filled 2017-04-09: qty 10

## 2017-04-09 MED ORDER — FENTANYL CITRATE (PF) 100 MCG/2ML IJ SOLN
INTRAMUSCULAR | Status: AC
  Filled 2017-04-09: qty 2

## 2017-04-09 MED ORDER — DIPHENHYDRAMINE HCL 25 MG PO CAPS
25.0000 mg | ORAL_CAPSULE | ORAL | Status: DC | PRN
Start: 2017-04-09 — End: 2017-04-09
  Filled 2017-04-09: qty 1

## 2017-04-09 MED ORDER — SENNOSIDES-DOCUSATE SODIUM 8.6-50 MG PO TABS
2.0000 | ORAL_TABLET | ORAL | Status: DC
  Administered 2017-04-09 – 2017-04-10 (×2): 2 via ORAL
  Filled 2017-04-09 (×2): qty 2

## 2017-04-09 MED ORDER — WITCH HAZEL-GLYCERIN EX PADS: 1.0000 "application " | MEDICATED_PAD | CUTANEOUS | Status: DC | PRN

## 2017-04-09 MED ORDER — METHYLERGONOVINE MALEATE 0.2 MG/ML IJ SOLN
INTRAMUSCULAR | Status: DC | PRN
  Administered 2017-04-09: 0.2 mg via INTRAMUSCULAR

## 2017-04-09 MED ORDER — DIBUCAINE 1 % RE OINT: 1.0000 "application " | TOPICAL_OINTMENT | RECTAL | Status: DC | PRN

## 2017-04-09 MED ORDER — METOCLOPRAMIDE HCL 5 MG/ML IJ SOLN: 10.0000 mg | Freq: Once | INTRAMUSCULAR | Status: DC | PRN

## 2017-04-09 MED ORDER — KETOROLAC TROMETHAMINE 30 MG/ML IJ SOLN
INTRAMUSCULAR | Status: AC
  Filled 2017-04-09: qty 1

## 2017-04-09 MED ORDER — MORPHINE SULFATE (PF) 0.5 MG/ML IJ SOLN
INTRAMUSCULAR | Status: DC | PRN
  Administered 2017-04-09: 4 mg via EPIDURAL

## 2017-04-09 MED ORDER — SODIUM CHLORIDE 0.9 % IV SOLN
1.0000 g | Freq: Four times a day (QID) | INTRAVENOUS | Status: DC
  Administered 2017-04-09 (×2): 1 g via INTRAVENOUS
  Filled 2017-04-09 (×5): qty 1000

## 2017-04-09 MED ORDER — SIMETHICONE 80 MG PO CHEW
80.0000 mg | CHEWABLE_TABLET | ORAL | Status: DC
  Administered 2017-04-09 – 2017-04-10 (×2): 80 mg via ORAL
  Filled 2017-04-09 (×2): qty 1

## 2017-04-09 MED ORDER — SERTRALINE HCL 50 MG PO TABS
50.0000 mg | ORAL_TABLET | Freq: Every day | ORAL | Status: DC
  Administered 2017-04-09 – 2017-04-10 (×2): 50 mg via ORAL
  Filled 2017-04-09 (×4): qty 1

## 2017-04-09 MED ORDER — ZOLPIDEM TARTRATE 5 MG PO TABS: 5.0000 mg | ORAL_TABLET | Freq: Every evening | ORAL | Status: DC | PRN

## 2017-04-09 MED ORDER — OXYTOCIN 40 UNITS IN LACTATED RINGERS INFUSION - SIMPLE MED: 2.5000 [IU]/h | INTRAVENOUS | Status: AC

## 2017-04-09 MED ORDER — DEXAMETHASONE SODIUM PHOSPHATE 4 MG/ML IJ SOLN
INTRAMUSCULAR | Status: AC
  Filled 2017-04-09: qty 1

## 2017-04-09 MED ORDER — TETANUS-DIPHTH-ACELL PERTUSSIS 5-2.5-18.5 LF-MCG/0.5 IM SUSP: 0.5000 mL | Freq: Once | INTRAMUSCULAR | Status: DC

## 2017-04-09 MED ORDER — DEXAMETHASONE SODIUM PHOSPHATE 4 MG/ML IJ SOLN
INTRAMUSCULAR | Status: DC | PRN
  Administered 2017-04-09: 4 mg via INTRAVENOUS

## 2017-04-09 MED ORDER — DIPHENHYDRAMINE HCL 25 MG PO CAPS: 25.0000 mg | ORAL_CAPSULE | Freq: Four times a day (QID) | ORAL | Status: DC | PRN

## 2017-04-09 MED ORDER — COCONUT OIL OIL
1.0000 "application " | TOPICAL_OIL | Status: DC | PRN
  Filled 2017-04-09: qty 120

## 2017-04-09 MED ORDER — SODIUM CHLORIDE 0.9% FLUSH: 3.0000 mL | INTRAVENOUS | Status: DC | PRN

## 2017-04-09 MED ORDER — PRENATAL MULTIVITAMIN CH
1.0000 | ORAL_TABLET | Freq: Every day | ORAL | Status: DC
  Administered 2017-04-10 – 2017-04-11 (×2): 1 via ORAL
  Filled 2017-04-09 (×2): qty 1

## 2017-04-09 MED ORDER — SCOPOLAMINE 1 MG/3DAYS TD PT72
MEDICATED_PATCH | TRANSDERMAL | Status: DC | PRN
  Administered 2017-04-09: 1 via TRANSDERMAL

## 2017-04-09 MED ORDER — SCOPOLAMINE 1 MG/3DAYS TD PT72
MEDICATED_PATCH | TRANSDERMAL | Status: AC
  Filled 2017-04-09: qty 1

## 2017-04-09 MED ORDER — LIDOCAINE HCL (PF) 1 % IJ SOLN
INTRAMUSCULAR | Status: DC | PRN
  Administered 2017-04-09: 13 mL via EPIDURAL

## 2017-04-09 MED ORDER — DEXTROSE 5 % IV SOLN
1.0000 ug/kg/h | INTRAVENOUS | Status: DC | PRN
Start: 2017-04-09 — End: 2017-04-09
  Filled 2017-04-09: qty 2

## 2017-04-09 MED ORDER — METHYLERGONOVINE MALEATE 0.2 MG/ML IJ SOLN
INTRAMUSCULAR | Status: AC
  Filled 2017-04-09: qty 1

## 2017-04-09 MED ORDER — SCOPOLAMINE 1 MG/3DAYS TD PT72: 1.0000 | MEDICATED_PATCH | Freq: Once | TRANSDERMAL | Status: DC

## 2017-04-09 MED ORDER — ONDANSETRON HCL 4 MG/2ML IJ SOLN
INTRAMUSCULAR | Status: DC | PRN
  Administered 2017-04-09: 4 mg via INTRAVENOUS

## 2017-04-09 MED ORDER — SIMETHICONE 80 MG PO CHEW
80.0000 mg | CHEWABLE_TABLET | Freq: Three times a day (TID) | ORAL | Status: DC
  Administered 2017-04-10 (×2): 80 mg via ORAL
  Filled 2017-04-09 (×4): qty 1

## 2017-04-09 MED ORDER — DIPHENHYDRAMINE HCL 50 MG/ML IJ SOLN: 12.5000 mg | INTRAMUSCULAR | Status: DC | PRN

## 2017-04-09 MED ORDER — LORATADINE 10 MG PO TABS
10.0000 mg | ORAL_TABLET | Freq: Every day | ORAL | Status: DC
  Filled 2017-04-09: qty 1

## 2017-04-09 SURGICAL SUPPLY — 34 items
BARRIER ADHS 3X4 INTERCEED (GAUZE/BANDAGES/DRESSINGS) IMPLANT
BRR ADH 4X3 ABS CNTRL BYND (GAUZE/BANDAGES/DRESSINGS)
CHLORAPREP W/TINT 26ML (MISCELLANEOUS) ×3 IMPLANT
CLAMP CORD UMBIL (MISCELLANEOUS) IMPLANT
CLOTH BEACON ORANGE TIMEOUT ST (SAFETY) ×3 IMPLANT
CONTAINER PREFILL 10% NBF 15ML (MISCELLANEOUS) IMPLANT
DRSG OPSITE POSTOP 4X10 (GAUZE/BANDAGES/DRESSINGS) ×3 IMPLANT
ELECT REM PT RETURN 9FT ADLT (ELECTROSURGICAL) ×3
ELECTRODE REM PT RTRN 9FT ADLT (ELECTROSURGICAL) ×1 IMPLANT
EXTRACTOR VACUUM M CUP 4 TUBE (SUCTIONS) IMPLANT
EXTRACTOR VACUUM M CUP 4' TUBE (SUCTIONS)
GLOVE BIO SURGEON STRL SZ 6.5 (GLOVE) ×2 IMPLANT
GLOVE BIO SURGEONS STRL SZ 6.5 (GLOVE) ×1
GLOVE BIOGEL PI IND STRL 7.0 (GLOVE) ×1 IMPLANT
GLOVE BIOGEL PI INDICATOR 7.0 (GLOVE) ×2
GOWN STRL REUS W/TWL LRG LVL3 (GOWN DISPOSABLE) ×6 IMPLANT
HEMOSTAT ARISTA ABSORB 3G PWDR (MISCELLANEOUS) ×3 IMPLANT
KIT ABG SYR 3ML LUER SLIP (SYRINGE) IMPLANT
NEEDLE HYPO 22GX1.5 SAFETY (NEEDLE) IMPLANT
NEEDLE HYPO 25X5/8 SAFETYGLIDE (NEEDLE) ×3 IMPLANT
NS IRRIG 1000ML POUR BTL (IV SOLUTION) ×3 IMPLANT
PACK C SECTION WH (CUSTOM PROCEDURE TRAY) ×3 IMPLANT
PAD OB MATERNITY 4.3X12.25 (PERSONAL CARE ITEMS) ×3 IMPLANT
PENCIL SMOKE EVAC W/HOLSTER (ELECTROSURGICAL) ×3 IMPLANT
SUT CHROMIC 0 CTX 36 (SUTURE) ×6 IMPLANT
SUT PLAIN 0 NONE (SUTURE) IMPLANT
SUT PLAIN 2 0 XLH (SUTURE) IMPLANT
SUT VIC AB 0 CT1 27 (SUTURE) ×12
SUT VIC AB 0 CT1 27XBRD ANBCTR (SUTURE) ×4 IMPLANT
SUT VIC AB 4-0 KS 27 (SUTURE) ×3 IMPLANT
SYR CONTROL 10ML LL (SYRINGE) IMPLANT
TAPE STRIPS DRAPE STRL (GAUZE/BANDAGES/DRESSINGS) ×3 IMPLANT
TOWEL OR 17X24 6PK STRL BLUE (TOWEL DISPOSABLE) ×3 IMPLANT
TRAY FOLEY BAG SILVER LF 14FR (SET/KITS/TRAYS/PACK) ×3 IMPLANT

## 2017-04-09 NOTE — Anesthesia Procedure Notes (Signed)
Epidural Patient location during procedure: OB Start time: 04/09/2017 5:15 AM End time: 04/09/2017 5:33 AM  Staffing Anesthesiologist: Anitra LauthMILLER, Lajoy Vanamburg RAY Performed: anesthesiologist   Preanesthetic Checklist Completed: patient identified, site marked, surgical consent, pre-op evaluation, timeout performed, IV checked, risks and benefits discussed and monitors and equipment checked  Epidural Patient position: sitting Prep: DuraPrep Patient monitoring: heart rate, cardiac monitor, continuous pulse ox and blood pressure Approach: midline Location: L2-L3 Injection technique: LOR saline  Needle:  Needle type: Tuohy  Needle gauge: 17 G Needle length: 9 cm Needle insertion depth: 6 cm Catheter type: closed end flexible Catheter size: 20 Guage Catheter at skin depth: 9 cm Test dose: negative  Assessment Events: blood not aspirated, injection not painful, no injection resistance, negative IV test and no paresthesia  Additional Notes Reason for block:procedure for pain

## 2017-04-09 NOTE — Op Note (Signed)
NAMMarita Young:  Young, Elizabeth               ACCOUNT NO.:  [id number]  MEDICAL RECORD NO.:  [id number]  LOCATION:                                 FACILITY:  PHYSICIAN:  Bhumi Godbey L. Vincente PoliGrewal, M.D.    DATE OF BIRTH:  DATE OF PROCEDURE:  04/09/2017 DATE OF DISCHARGE:                              OPERATIVE REPORT   PREOPERATIVE DIAGNOSES: 1. Intrauterine pregnancy at 39 weeks and 4 days. 2. Failed vacuum. 3. Occiput posterior position.  POSTOPERATIVE DIAGNOSES: 1. Intrauterine pregnancy at 39 weeks and 4 days. 2. Failed vacuum. 3. Occiput posterior position.  PROCEDURE:  Primary low-transverse cesarean section.  SURGEON:  Jeferson Boozer L. Vincente PoliGrewal, M.D.  ANESTHESIA:  Epidural.  ESTIMATED BLOOD LOSS:  Less than 500 mL.  COMPLICATIONS:  None.  DRAINS:  Foley.  DESCRIPTION OF PROCEDURE:  The patient was taken to the operating room. She was prepped and draped in usual sterile fashion.  Foley catheter had been inserted while in Labor and Delivery.  A low transverse incision was made and carried down to the fascia.  The fascia was scored in the midline, extended laterally.  The rectus muscles were separated in the midline.  The peritoneum was entered bluntly.  The peritoneal incision was then stretched.  The lower uterine segment was identified and a bladder flap was created sharply and then digitally.  The bladder blade was then readjusted.  A low transverse incision was made on the uterus. The uterus was entered using a hemostat.  The baby was in OP position, was delivered easily after rotation and a vacuum, was an LGA baby, weighing 10 pounds 2 ounces, it was a female infant.  The cord was clamped and cut.  The baby was handed to the awaiting Neonatal team. Placenta was manually removed, noted to be normal intact with a 3-vessel cord, and uterus was exteriorized and cleared of all clots and debris. Uterus was closed in 2 layers using 0 chromic in a running lock stitch. The uterus was returned  to the abdomen.  Irrigation performed. Hemostasis was noted.  Arista was applied at the incision.  The peritoneum was closed using 0 Vicryl.  The fascia was closed using 0 Vicryl, starting at each corner and meeting in the midline.  After irrigation of subcutaneous layer, the skin was closed with a 3-0 Vicryl on a Keith needle.  All sponge, lap, and instrument counts were correct x2.  The patient went to the recovery room in stable condition.     Aliene Tamura L. Vincente PoliGrewal, M.D.     Florestine AversMLG/MEDQ  D:  04/09/2017  T:  04/09/2017  Job:  [id number]

## 2017-04-09 NOTE — Progress Notes (Signed)
Pit on 8 mu/min with ctx now q 3 min, cx 3+ forebag ruptured>>.clear AF, will prob want epid soon, will start ABX for prolonged ROM
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
## 2017-04-09 NOTE — Brief Op Note (Signed)
04/08/2017 - 04/09/2017  2:55 PM  PATIENT:  Elizabeth Young  31 y.o. female  PRE-OPERATIVE DIAGNOSIS:  IUP at 6939 w 4 days Failed Vacuum  POST-OPERATIVE DIAGNOSIS:  Same OP position  PROCEDURE:  Procedure(s): CESAREAN SECTION (N/A)  SURGEON:  Surgeon(s) and Role:    * Marcelle OverlieGrewal, Trinity Hyland, MD - Primary  PHYSICIAN ASSISTANT:   ASSISTANTS: none   ANESTHESIA:   epidural  EBL:  Total I/O In: 1900 [I.V.:1900] Out: 1350 [Urine:550; Blood:800]  BLOOD ADMINISTERED:none  DRAINS: Urinary Catheter (Foley)   LOCAL MEDICATIONS USED:  NONE  SPECIMEN:  No Specimen  DISPOSITION OF SPECIMEN:  N/A  COUNTS:  YES  TOURNIQUET:  * No tourniquets in log *  DICTATION: .Other Dictation: Dictation Number [id number]  PLAN OF CARE: Admit to inpatient   PATIENT DISPOSITION:  PACU - hemodynamically stable.   Delay start of Pharmacological VTE agent (>24hrs) due to surgical blood loss or risk of bleeding: not applicable

## 2017-04-09 NOTE — Anesthesia Preprocedure Evaluation (Signed)
Anesthesia Evaluation  Patient identified by MRN, date of birth, ID band Patient awake    Reviewed: Allergy & Precautions, NPO status , Patient's Chart, lab work & pertinent test results  Airway Mallampati: II  TM Distance: >3 FB Neck ROM: Full    Dental no notable dental hx.    Pulmonary neg pulmonary ROS, former smoker,    Pulmonary exam normal breath sounds clear to auscultation       Cardiovascular negative cardio ROS Normal cardiovascular exam Rhythm:Regular Rate:Normal     Neuro/Psych  Headaches, negative neurological ROS  negative psych ROS   GI/Hepatic negative GI ROS, Neg liver ROS,   Endo/Other  negative endocrine ROS  Renal/GU negative Renal ROS  negative genitourinary   Musculoskeletal negative musculoskeletal ROS (+)   Abdominal   Peds negative pediatric ROS (+)  Hematology negative hematology ROS (+)   Anesthesia Other Findings   Reproductive/Obstetrics negative OB ROS (+) Pregnancy                             Anesthesia Physical Anesthesia Plan  ASA: II  Anesthesia Plan: Epidural   Post-op Pain Management:    Induction:   Airway Management Planned:   Additional Equipment:   Intra-op Plan:   Post-operative Plan:   Informed Consent:   Plan Discussed with:   Anesthesia Plan Comments:         Anesthesia Quick Evaluation

## 2017-04-09 NOTE — Transfer of Care (Signed)
Immediate Anesthesia Transfer of Care Note  Patient: Elizabeth Young  Procedure(s) Performed: Procedure(s): CESAREAN SECTION (N/A)  Patient Location: PACU  Anesthesia Type:Epidural  Level of Consciousness: awake, alert  and oriented  Airway & Oxygen Therapy: Patient Spontanous Breathing  Post-op Assessment: Report given to RN and Post -op Vital signs reviewed and stable  Post vital signs: Reviewed and stable HR 100, RR 16, BP 131/70, SaO2 100%  Last Vitals:  Vitals:   04/09/17 1224 04/09/17 1236  BP:    Pulse:    Resp: 18 20  Temp:      Last Pain:  Vitals:   04/09/17 1201  TempSrc:   PainSc: 0-No pain         Complications: No apparent anesthesia complications

## 2017-04-09 NOTE — Lactation Note (Signed)
This note was copied from a baby's chart. Lactation Consultation Note  Patient Name: Elizabeth Young GNFAO'ZToday's Date: 04/09/2017 Reason for consult: Follow-up assessment;Other (Comment) (Hypoglycemia on the infant)   Spoke with Mikey BussingJessica Stilson, RN in CN. Infant blood glucose 38. Returned to PACU and mom being transferred to floor, followed mom to PP room 135. Attempted to latch infant to breast and infant coming on and off the breast. Hand expressed and spoon fed infant 10 cc Colostrum via spoon. Infant then latched to left breast in the laid back cross cradle hold and [facility] Repeat CBG at 16:30.   Hand expressed and additional 3 cc colostrum that was left at bedside for mom to give with next feeding. Enc mom to feed infant 8-12 x in 24 hours at first feeding cues. Enc mom to hand express and spoon feed post BF. Mom practiced hand expression. Enc mom to call out for feeding assistance as needed. Enc mom to rest between feeds.    Maternal Data Formula Feeding for Exclusion: No Has patient been taught Hand Expression?: Yes Does the patient have breastfeeding experience prior to this delivery?: No  Feeding Feeding Type: Breast Fed Length of feed: 10 min  LATCH Score/Interventions Latch: Repeated attempts needed to sustain latch, nipple held in mouth throughout feeding, stimulation needed to elicit sucking reflex. Intervention(s): Adjust position;Assist with latch;Breast massage;Breast compression  Audible Swallowing: A few with stimulation Intervention(s): Alternate breast massage;Skin to skin;Hand expression  Type of Nipple: Flat (everts with stim)  Comfort (Breast/Nipple): Soft / non-tender     Hold (Positioning): Assistance needed to correctly position infant at breast and maintain latch. Intervention(s): Breastfeeding basics reviewed;Support Pillows;Position options;Skin to skin  LATCH Score: 6  Lactation Tools Discussed/Used WIC Program:  No   Consult Status Consult Status: Follow-up Date: 04/10/17 Follow-up type: In-patient    Silas FloodSharon S Gauri Galvao 04/09/2017, 4:27 PM

## 2017-04-09 NOTE — Lactation Note (Signed)
This note was copied from a baby's chart. Lactation Consultation Note  Patient Name: Elizabeth Young Reason for consult: Initial assessment   Initial assessment with first time mom of 1 hour old infant in PACU. Infant has previously fed in OR. Infant being weighed when LC entered room, mom in a lot of pain. Infant noted to be jittery, CBG is pending. Infant LGA. Mom reports she failed her 1 hour GTT but passed the 3 hour test. Mom was drowsy throughout assessment and falling asleep some.   Mom agreeable to latching infant to breast. Assisted mom in latching infant to both breasts for 20 minutes each in the laid back side lying position to avoid pressure on mom's incision. Infant fed well with flanged lips, rhythmic suckling and intermittent swallows. Mom with semi firm breasts and areola with semi flat nipples that evert with stimulation and infant suckling. Colostrum very easily expressible. Infant was spoon fed 3 cc colostrum then left still feeding when LC left the room. Infant was noted to continue to be jittery.   Enc mom to feed infant STS 8-12 x in 24 hours at first feeding cues. Enc mom to use good head and pillow support with feedings. Enc mom to call out for assistance as needed. Colostrum and milk coming to volume reviewed. Mom will most likely need reiteration of basic teaching.   BF Resources handout and LC Brochure given, mom informed of IP/OP Services, BF Support Groups and LC phone #. Mom reports she is a [employer], [employer] Employee and will get a Free Style pump at d/c.     Maternal Data Formula Feeding for Exclusion: No Has patient been taught Hand Expression?: Yes Does the patient have breastfeeding experience prior to this delivery?: No  Feeding Feeding Type: Breast Fed Length of feed: 30 min  LATCH Score/Interventions Latch: Grasps breast easily, tongue down, lips flanged, rhythmical sucking.  Audible Swallowing: Spontaneous and intermittent  Type of  Nipple: Flat (semi flat, more erect with stimulation)  Comfort (Breast/Nipple): Soft / non-tender     Hold (Positioning): Assistance needed to correctly position infant at breast and maintain latch. Intervention(s): Breastfeeding basics reviewed;Support Pillows;Position options;Skin to skin  LATCH Score: 8  Lactation Tools Discussed/Used WIC Program: No   Consult Status Consult Status: Follow-up Date: 04/10/17 Follow-up type: In-patient    Elizabeth Young Young, 4:14 PM

## 2017-04-09 NOTE — H&P (Signed)
NAMEduardo Osier:  Bollig, Chenel               ACCOUNT NO.:  [id number]  MEDICAL RECORD NO.:  [id number]  LOCATION:                                 FACILITY:  PHYSICIAN:  Duke Salviaichard M. Marcelle OverlieHolland, M.D.    DATE OF BIRTH:  DATE OF ADMISSION:  04/08/2017 DATE OF DISCHARGE:                             HISTORY & PHYSICAL   CHIEF COMPLAINT:  Rupture of membranes, at term.  HISTORY OF PRESENT ILLNESS:  A 31 year old, G1, P0, EDD is 05/21, was seen in the office today stating she had been leaking some fluid off and on since 9 o'clock last night.  Cervix was noted to be 3.  Clear fluid was noted.  She was sent over to Labor and delivery.  GBS is negative. Of note, she has had a prior LEEP done in the past.  Blood type is O positive.  PAST MEDICAL HISTORY:  ALLERGIES:  Sulfa.  REVIEW OF SYSTEMS:  Significant for history of panic disorder.  She has had prior LEEP in 2005, lithotripsy in 2010.  For the remainder of her family and social history, please see the [facility] form for details.  PHYSICAL EXAMINATION:  VITAL SIGNS:  Temp 98.2, blood pressure 138/82. HEENT:  Unremarkable. NECK:  Supple without masses. LUNGS:  Clear. CARDIOVASCULAR:  Regular rate and rhythm without murmurs, rubs, gallops noted. BREASTS:  Not examined. PELVIC:  Term fundal height.  Fetal heart rate 140.  Cervix was 3. Clear fluid noted.  Vertex.  IMPRESSION:  Term pregnancy, ruptured membranes, group B streptococcus negative.  PLAN:  We will admit for labor, possible augmentation of labor with Pitocin.     Jyasia Markoff M. Marcelle OverlieHolland, M.D.   ______________________________ Duke Salviaichard M. Marcelle OverlieHolland, M.D.    [facility]/MEDQ  D:  04/08/2017  T:  04/08/2017  Job:  [id number]

## 2017-04-09 NOTE — Progress Notes (Signed)
Patient is comfortable with epidural  FHR is category 1 Cervix is 90% 5 - 6 0 station  Follow labor curve Anticipate NSVD

## 2017-04-09 NOTE — Progress Notes (Signed)
Honeycomb dressing 3/4 saturated with bloody drainage. Notified Dr. Vincente PoliGrewal. Orders to changed honeycomb and apply a pressure dressing.

## 2017-04-10 LAB — CBC
HCT: 24.6 % — ABNORMAL LOW (ref 36.0–46.0)
HEMOGLOBIN: 8.4 g/dL — AB (ref 12.0–15.0)
MCH: 31.9 pg (ref 26.0–34.0)
MCHC: 34.1 g/dL (ref 30.0–36.0)
MCV: 93.5 fL (ref 78.0–100.0)
Platelets: 129 10*3/uL — ABNORMAL LOW (ref 150–400)
RBC: 2.63 MIL/uL — AB (ref 3.87–5.11)
RDW: 14.2 % (ref 11.5–15.5)
WBC: 19.8 10*3/uL — AB (ref 4.0–10.5)

## 2017-04-10 MED ORDER — DIPHENHYDRAMINE-ZINC ACETATE 2-0.1 % EX CREA
TOPICAL_CREAM | Freq: Two times a day (BID) | CUTANEOUS | Status: DC | PRN
  Administered 2017-04-10: 13:00:00 via TOPICAL
  Filled 2017-04-10: qty 28

## 2017-04-10 MED ORDER — HYDROCODONE-ACETAMINOPHEN 5-325 MG PO TABS
1.0000 | ORAL_TABLET | ORAL | Status: DC | PRN
  Administered 2017-04-10 – 2017-04-11 (×4): 1 via ORAL
  Filled 2017-04-10 (×5): qty 1

## 2017-04-10 NOTE — Progress Notes (Signed)
Pt encouraged to use her Incentive spirometer and have SCD's on while in bed.

## 2017-04-10 NOTE — Progress Notes (Signed)
Dressing changed by Lonia MadNatalie RN per MD order. Pt tolerated well.  No drainage, odor. Edges approximated and steri strips changed.

## 2017-04-10 NOTE — Progress Notes (Signed)
Patient doing well.  BP 103/67 (BP Location: Right Arm)   Pulse 100   Temp 98.1 F (36.7 C)   Resp 16   Ht 5\' 5"  (1.651 m)   Wt 91.6 kg (202 lb)   SpO2 96%   Breastfeeding? Unknown   BMI 33.61 kg/m  Results for orders placed or performed during the hospital encounter of 04/08/17 (from the past 24 hour(s))  CBC     Status: Abnormal   Collection Time: 04/10/17  6:47 AM  Result Value Ref Range   WBC 19.8 (H) 4.0 - 10.5 K/uL   RBC 2.63 (L) 3.87 - 5.11 MIL/uL   Hemoglobin 8.4 (L) 12.0 - 15.0 g/dL   HCT 14.724.6 (L) 82.936.0 - 56.246.0 %   MCV 93.5 78.0 - 100.0 fL   MCH 31.9 26.0 - 34.0 pg   MCHC 34.1 30.0 - 36.0 g/dL   RDW 13.014.2 86.511.5 - 78.415.5 %   Platelets 129 (L) 150 - 400 K/uL   Abdomen is soft and non tender Bandage is clean and dry  Impression; POD #1  Doing well Routine care

## 2017-04-10 NOTE — Progress Notes (Signed)
Dr. Vincente PoliGrewal called to get an order to change honeycomb dressing. FOB called out and stated that mom's dressing was filled with water again. Will change per order.

## 2017-04-10 NOTE — Progress Notes (Signed)
Pt took off pulse ox probe and refused to replace it. Pt also removed SCDs. Encouraged pt to wear SCDs but pt declined states its too hot to wear.

## 2017-04-10 NOTE — Anesthesia Postprocedure Evaluation (Signed)
Anesthesia Post Note  Patient: Elizabeth Young  Procedure(s) Performed: Procedure(s) (LRB): CESAREAN SECTION (N/A)  Patient location during evaluation: Mother Baby Anesthesia Type: Epidural Level of consciousness: awake and alert and oriented Pain management: pain level controlled Vital Signs Assessment: post-procedure vital signs reviewed and stable Respiratory status: spontaneous breathing and nonlabored ventilation Cardiovascular status: stable Postop Assessment: no headache, no backache, epidural receding, patient able to bend at knees, no signs of nausea or vomiting and adequate PO intake Anesthetic complications: no        Last Vitals:  Vitals:   04/10/17 0300 04/10/17 0951  BP: 103/67 108/62  Pulse: 100 78  Resp: 16 16  Temp: 36.7 C 36.7 C    Last Pain:  Vitals:   04/10/17 0951  TempSrc: Oral  PainSc:    Pain Goal:                 Laban EmperorMalinova,Tiffnay Bossi Hristova

## 2017-04-10 NOTE — Lactation Note (Signed)
This note was copied from a baby's chart. Lactation Consultation Note: Mother reports that breastfeeding has made her nipples sore. Observed that mother does have small scabs on both nipples. Mother has small nipples and when breast sandwiched  nipple is semi-flat. Mother assist with latching infant on in cross cradle hold. Several attempts to latch infant . Infant refused breast with screaming.  Reviewed application of the nipple shield. Placed the nipple shield on the right breast. Infant latched on with a shallow latch. Infant was given small amt of formula through the nipple shield. Infant suckled deep for 5 mins. On and off.  Observed frequent suckling and then came off after a few mins. Staff nurse to sat up DEBP , mother was advised to pump after each feeding and supplement infant with ebm formula as needed. Discussed finger feeding infant with formula. Parents receptive to all teaching.   Patient Name: Elizabeth Eduardo OsierJessica Young ZOXWR'UToday's Date: 04/10/2017 Reason for consult: Follow-up assessment   Maternal Data    Feeding Feeding Type: Breast Fed Length of feed: 20 min  LATCH Score/Interventions Latch: Repeated attempts needed to sustain latch, nipple held in mouth throughout feeding, stimulation needed to elicit sucking reflex. Intervention(s): Adjust position;Assist with latch;Breast compression  Audible Swallowing: A few with stimulation Intervention(s): Skin to skin;Hand expression  Type of Nipple: Everted at rest and after stimulation  Comfort (Breast/Nipple): Soft / non-tender     Hold (Positioning): Assistance needed to correctly position infant at breast and maintain latch. Intervention(s): Breastfeeding basics reviewed;Support Pillows;Position options;Skin to skin  LATCH Score: 7  Lactation Tools Discussed/Used Tools: Nipple Shields Nipple shield size: 20   Consult Status Consult Status: Follow-up Date: 04/10/17 Follow-up type: In-patient    Stevan BornKendrick, Guiseppe Flanagan  [facility] 04/10/2017, 2:21 PM

## 2017-04-11 LAB — BIRTH TISSUE RECOVERY COLLECTION (PLACENTA DONATION)

## 2017-04-11 MED ORDER — HYDROCODONE-ACETAMINOPHEN 5-325 MG PO TABS: 1.0000 | ORAL_TABLET | ORAL | 0 refills | Status: AC | PRN

## 2017-04-11 MED ORDER — IBUPROFEN 600 MG PO TABS: 600.0000 mg | ORAL_TABLET | Freq: Four times a day (QID) | ORAL | 0 refills | Status: AC

## 2017-04-11 NOTE — Discharge Summary (Signed)
Obstetric Discharge Summary Reason for Admission: rupture of membranes Prenatal Procedures: none Intrapartum Procedures: cesarean: low cervical, transverse Postpartum Procedures: none Complications-Operative and Postpartum: none Hemoglobin  Date Value Ref Range Status  04/10/2017 8.4 (L) 12.0 - 15.0 g/dL Final    Comment:    REPEATED TO VERIFY DELTA CHECK NOTED    HCT  Date Value Ref Range Status  04/10/2017 24.6 (L) 36.0 - 46.0 % Final    Physical Exam:  General: alert, cooperative and appears stated age 13Lochia: appropriate Uterine Fundus: firm Incision: healing well, no significant drainage, no dehiscence DVT Evaluation: No evidence of DVT seen on physical exam.  Discharge Diagnoses: Term Pregnancy-delivered  Discharge Information: Date: 04/11/2017 Activity: pelvic rest Diet: routine Medications: Ibuprofen and Vicodin Condition: improved Instructions: refer to practice specific booklet Discharge to: home   Newborn Data: Live born female  Birth Weight: 10 lb 2.4 oz (4605 g) APGAR: 9, 9  Home with mother.  Coreena Rubalcava L 04/11/2017, 8:15 AM

## 2017-04-11 NOTE — Lactation Note (Signed)
This note was copied from a baby's chart. Lactation Consultation Note: Mother reports infant breastfed earlier this morning. Mother reports that she felt strong tugging without pain. Mother has been bottle feeding since and has not attempt to breastfeed again since 6 am. Mother has been pumping and reports not getting any milk . Mother denies any fullness in her breast.  Suggested trying a SNS. SNS was sat up with 30 ml of formula. Infant was placed in football hold. Attempt to latch and infant began to spit up moderate amts of formula. Reviewed use of the SNS and suggested trying later when infant is ready for next feeding. Mother encouraged to do frequent skin to skin with infant and attempt to latch infant before offering a bottle. Mother reports that she really wants to breastfeed infant. Advised mother to do good breast massage when milk starts coming in. 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 She choose the FreeStyle pump. Mother advised to post pump every 2-3 hours for 15-20 mins. Advised to ice breast for 15 mins every 3-4 hours . Mother was given comfort gels . She has cracks on both nipples. Mother was scheduled for an outpatient visit on May 25 at 11:30. Mother to phone office with questions or concerns. Advised to feed  Infant with feeding cues at least 8-12 times in 24 hours.   Patient Name: Elizabeth Eduardo OsierJessica Young ZOXWR'UToday's Date: 04/11/2017 Reason for consult: Follow-up assessment   Maternal Data    Feeding Feeding Type: Formula  LATCH Score/Interventions Latch:  (no latch achieved)                    Lactation Tools Discussed/Used     Consult Status Consult Status: Follow-up Date: 04/16/17 Follow-up type: In-patient    Stevan BornKendrick, Ryu Cerreta [facility] 04/11/2017, 1:49 PM

## 2017-04-20 DIAGNOSIS — D649 Anemia, unspecified: Secondary | ICD-10-CM | POA: Diagnosis not present

## 2017-05-17 DIAGNOSIS — Z1389 Encounter for screening for other disorder: Secondary | ICD-10-CM | POA: Diagnosis not present

## 2017-10-20 DIAGNOSIS — L218 Other seborrheic dermatitis: Secondary | ICD-10-CM | POA: Diagnosis not present

## 2017-10-20 DIAGNOSIS — L821 Other seborrheic keratosis: Secondary | ICD-10-CM | POA: Diagnosis not present

## 2017-10-27 DIAGNOSIS — R6882 Decreased libido: Secondary | ICD-10-CM | POA: Diagnosis not present

## 2018-01-04 DIAGNOSIS — J020 Streptococcal pharyngitis: Secondary | ICD-10-CM | POA: Diagnosis not present

## 2018-01-06 DIAGNOSIS — E559 Vitamin D deficiency, unspecified: Secondary | ICD-10-CM | POA: Diagnosis not present

## 2018-01-06 DIAGNOSIS — Z13220 Encounter for screening for lipoid disorders: Secondary | ICD-10-CM | POA: Diagnosis not present

## 2018-01-06 DIAGNOSIS — F4320 Adjustment disorder, unspecified: Secondary | ICD-10-CM | POA: Diagnosis not present

## 2018-01-06 DIAGNOSIS — Z0000 Encounter for general adult medical examination without abnormal findings: Secondary | ICD-10-CM | POA: Diagnosis not present

## 2018-01-06 DIAGNOSIS — Z87442 Personal history of urinary calculi: Secondary | ICD-10-CM | POA: Diagnosis not present

## 2018-02-04 DIAGNOSIS — N760 Acute vaginitis: Secondary | ICD-10-CM | POA: Diagnosis not present

## 2018-05-20 DIAGNOSIS — R946 Abnormal results of thyroid function studies: Secondary | ICD-10-CM | POA: Diagnosis not present

## 2018-07-06 DIAGNOSIS — Z6825 Body mass index (BMI) 25.0-25.9, adult: Secondary | ICD-10-CM | POA: Diagnosis not present

## 2018-07-06 DIAGNOSIS — Z01419 Encounter for gynecological examination (general) (routine) without abnormal findings: Secondary | ICD-10-CM | POA: Diagnosis not present

## 2018-08-29 DIAGNOSIS — R300 Dysuria: Secondary | ICD-10-CM | POA: Diagnosis not present

## 2018-10-28 DIAGNOSIS — L308 Other specified dermatitis: Secondary | ICD-10-CM | POA: Diagnosis not present

## 2018-12-26 MED FILL — CLOBETASOL PROPIONATE 0.05: 0.05 | 30 days supply | Qty: 60 | Fill #0

## 2019-01-01 ENCOUNTER — Ambulatory Visit (INDEPENDENT_AMBULATORY_CARE_PROVIDER_SITE_OTHER): Payer: Self-pay | Admitting: Family Medicine

## 2019-01-01 ENCOUNTER — Encounter: Payer: Self-pay | Admitting: Family Medicine

## 2019-01-01 VITALS — BP 95/60 | HR 77 | Temp 98.5°F | Resp 16 | Wt 149.6 lb

## 2019-01-01 DIAGNOSIS — N61 Mastitis without abscess: Secondary | ICD-10-CM

## 2019-01-01 MED ORDER — CEPHALEXIN 500 MG PO CAPS: 500.0000 mg | ORAL_CAPSULE | Freq: Two times a day (BID) | ORAL | 0 refills | Status: AC

## 2019-01-01 NOTE — Patient Instructions (Signed)
Mastitis    Mastitis is inflammation of the breast tissue. It occurs most often in women who are breastfeeding, but it can also affect other women, and sometimes even men.  What are the causes?  This condition is usually caused by a bacterial infection. Bacteria enter the breast tissue through cuts or openings in the skin. Typically, this occurs with breastfeeding because of cracked or irritated nipples. Sometimes, it can occur when there is no opening in the skin. This is usually caused by plugged milk ducts.  Other causes include:   Nipple piercing.   Some forms of breast cancer.  What are the signs or symptoms?  Symptoms of this condition include:   Swelling, redness, tenderness, and pain in an area of the breast. The area may also feel warm to the touch. These symptoms usually affect the upper part of the breast, toward the armpit region.   Swelling of the glands under the arm on the same side.   Fever.   Rapid pulse.   Fatigue, headache, and flu-like muscle aches.  If an infection is allowed to progress, a collection of pus (abscess) may develop.  How is this diagnosed?  This condition can usually be diagnosed based on a physical exam and your symptoms. You may also have other tests, such as:   Blood tests to determine if your body is fighting a bacterial infection.   Mammogram or ultrasound tests to rule out other problems or diseases.   Testing of pus and other fluids. Pus from the breast may be collected and examined in the lab. If an abscess has developed, the fluid in the abscess can be removed with a needle. This test can be used to confirm the diagnosis and identify the bacteria present.   If you are breastfeeding, breast milk may be cultured and tested for bacteria.  How is this treated?  Treatment for this condition may include:   Applying heat or cold compresses to the affected area.   Medicine for pain.   Antibiotic medicine to treat a bacterial infection. This is usually taken by  mouth.   Self-care such as rest and increased fluid intake.   If an abscess has developed, it may be treated by removing fluid with a needle.  Mastitis that occurs with breastfeeding will sometimes go away on its own, so your health care provider may choose to wait 24 hours after first seeing you to decide whether a prescription medicine is needed. You may be told of different ways to help manage breastfeeding, such as continuing to breastfeed or pump in order to ensure adequate milk flow.  Follow these instructions at home:  Medicines   Take over-the-counter and prescription medicines only as told by your health care provider.   If you were prescribed an antibiotic medicine, take it as told by your health care provider. Do not stop taking the antibiotic even if you start to feel better.  General instructions   Do not wear a tight or underwire bra. Wear a soft, supportive bra.   Increase your fluid intake, especially if you have a fever.   Get plenty of rest.  If you are breastfeeding:   Continue to empty your breasts as often as possible either by breastfeeding or by using a breast pump. This will decrease the pressure and the pain that comes with it. Ask your health care provider if changes need to be made to your breastfeeding or pumping routine.   Keep your nipples clean and dry.     During breastfeeding, empty the first breast completely before going to the other breast. If your baby is not emptying your breasts completely, use a breast pump to empty your breasts.   Use breast massage during feeding or pumping sessions.   If directed, apply moist heat to the affected area of your breast right before breastfeeding or pumping. Use the heat source that your health care provider recommends.   If directed, put ice on the affected area of your breast right after breastfeeding or pumping:  ? Put ice in a plastic bag.  ? Place a towel between your skin and the bag.  ? Leave the ice on for 20 minutes.   If  you go back to work, pump your breasts while at work to stay within your nursing schedule.   Avoid allowing your breasts to become overly filled with milk (engorged).  Contact a health care provider if:   You have pus-like discharge from the breast.   You have a fever.   Your symptoms do not improve within 2 days of starting treatment.  Get help right away if:   Your pain and swelling are getting worse.   You have pain that is not controlled with medicine.   You have a red line extending from the breast toward your armpit.  Summary   Mastitis is inflammation of the breast tissue. It occurs most often in women who are breastfeeding, but it can also affect non-breastfeeding women and some men.   This condition is usually caused by a bacterial infection.   This condition may be treated with hot and cold compresses, medicines, self-care, and certain breastfeeding strategies.   If you were prescribed an antibiotic medicine, take it as told by your health care provider. Do not stop taking the antibiotic even if you start to feel better.  This information is not intended to replace advice given to you by your health care provider. Make sure you discuss any questions you have with your health care provider.  Document Released: 11/09/2005 Document Revised: 12/01/2016 Document Reviewed: 12/01/2016  Elsevier Interactive Patient Education  2019 [employer]

## 2019-01-01 NOTE — Progress Notes (Signed)
Elizabeth Young is a 33 y.o. female who presents today with 5 days of body aches, and chills, fever and breast pain. She has attempted to use antipyretic for this condition. Of note she reports a recent URI and has been using pseudoephedrine and has concerns related to milk supply, even though she is considering discontinue breast feeding. She reports she has been breast feeding twice daily am and pm for the past 2 years. 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 She does not think the pain in her breast is related to the CPR and more so because that symptoms are limited to her right breast.       Review of Systems  Constitutional: Negative for chills, fever and malaise/fatigue.  HENT: Negative for congestion, ear discharge, ear pain, sinus pain and sore throat.   Eyes: Negative.   Respiratory: Negative for cough, sputum production and shortness of breath.   Cardiovascular: Negative.  Negative for chest pain.  Gastrointestinal: Negative for abdominal pain, diarrhea, nausea and vomiting.  Genitourinary: Negative for dysuria, frequency, hematuria and urgency.  Musculoskeletal: Negative for myalgias.  Skin: Negative.  Negative for itching and rash.       Skin of right breast red and tender  Neurological: Positive for dizziness. Negative for headaches.  Endo/Heme/Allergies: Negative.   Psychiatric/Behavioral: Negative.     [name] has a current medication list which includes the following prescription(s): acetaminophen, sertraline, cetirizine, ferralet 90, hydrocodone-acetaminophen, ibuprofen, prenatal adult gummy/dha/fa, and ranitidine. Also is allergic to bactrim [sulfamethoxazole-trimethoprim] and sulfa antibiotics.  Elizabeth Young  has a past medical history of Anxiety, Heart palpitations (09/03/2015), Hyperchloremia, Kidney stone, and Migraine. Also  has a past surgical history that includes Wisdom tooth extraction;  Lithotripsy; and Cesarean section (N/A, 04/09/2017).    O: Vitals:   01/01/19 1540  BP: 95/60  Pulse: 77  Resp: 16  Temp: 98.5 F (36.9 C)  SpO2: 99%     Physical Exam Vitals signs reviewed.  Constitutional:      Appearance: She is well-developed. She is not toxic-appearing.  HENT:     Head: Normocephalic.     Right Ear: Hearing, tympanic membrane, ear canal and external ear normal.     Left Ear: Hearing, tympanic membrane, ear canal and external ear normal.     Nose: Nose normal.     Mouth/Throat:     Pharynx: Uvula midline.  Neck:     Musculoskeletal: Normal range of motion and neck supple.  Cardiovascular:     Rate and Rhythm: Normal rate and regular rhythm.     Pulses: Normal pulses.     Heart sounds: Normal heart sounds.  Pulmonary:     Effort: Pulmonary effort is normal.     Breath sounds: Normal breath sounds.  Chest:     Chest wall: No mass, lacerations, deformity, swelling, tenderness, crepitus or edema. There is no dullness to percussion.     Breasts: Breasts are symmetrical.        Right: Tenderness present. No swelling, bleeding, inverted nipple, nipple discharge or skin change.        Left: Normal. No swelling, bleeding, inverted nipple, mass, nipple discharge, skin change or tenderness.       Comments: Mild area approx. 3 in x 3 in area (see above) that is erythremic and more firm than surrounding tissue as well as compared to the other side- no evidence of deformity or other lumps  or masses. No other breast deformities noted. No evidence of nipple discharge or involvement. No evidence of lymphnode (axillary) enlargement Abdominal:     General: Bowel sounds are normal.     Palpations: Abdomen is soft.  Musculoskeletal: Normal range of motion.  Lymphadenopathy:     Head:     Right side of head: No submental or submandibular adenopathy.     Left side of head: No submental or submandibular adenopathy.     Cervical: No cervical adenopathy.     Upper Body:       Right upper body: No supraclavicular, axillary or pectoral adenopathy.     Left upper body: No supraclavicular, axillary or pectoral adenopathy.  Skin:    General: Skin is warm.  Neurological:     Mental Status: She is alert and oriented to person, place, and time.    A: 1. Mastitis, right, acute    P: 1. Mastitis, right, acute Discussed mild presentation and patient desire to d/c breastfeeding and no relief with pumping- discussed watchful waiting for 24-48 hours and patient to begin medicaiton if no improvement. Patieint is to contact lactation for information regarding breastfeeding concerns and future. Patient v/u-  Meds ordered this encounter  Medications  . cephALEXin (KEFLEX) 500 MG capsule    Sig: Take 1 capsule (500 mg total) by mouth 2 (two) times daily for 7 days.    Dispense:  14 capsule    Refill:  0    Order Specific Question:   Supervising Provider    Answer:   Hyacinth MeekerMILLER, BRIAN [[id number]]    Other orders - acetaminophen (TYLENOL) 500 MG tablet; Take 500 mg by mouth every 6 (six) hours as needed.   Discussed with patient exam findings, suspected diagnosis etiology and  reviewed recommended treatment plan and follow up, including complications and indications for urgent medical follow up and evaluation. Medications including use and indications reviewed with patient. Patient provided relevant patient education on diagnosis and/or relevant related condition that were discussed and reviewed with patient at discharge. Patient verbalized understanding of information provided and agrees with plan of care (POC), all questions answered.

## 2019-03-21 DIAGNOSIS — R7989 Other specified abnormal findings of blood chemistry: Secondary | ICD-10-CM | POA: Diagnosis not present

## 2019-03-21 DIAGNOSIS — Z0000 Encounter for general adult medical examination without abnormal findings: Secondary | ICD-10-CM | POA: Diagnosis not present

## 2019-03-21 DIAGNOSIS — F4320 Adjustment disorder, unspecified: Secondary | ICD-10-CM | POA: Diagnosis not present

## 2019-03-21 DIAGNOSIS — E559 Vitamin D deficiency, unspecified: Secondary | ICD-10-CM | POA: Diagnosis not present

## 2019-03-21 DIAGNOSIS — Z87442 Personal history of urinary calculi: Secondary | ICD-10-CM | POA: Diagnosis not present

## 2019-04-03 DIAGNOSIS — M25562 Pain in left knee: Secondary | ICD-10-CM | POA: Diagnosis not present

## 2019-04-10 ENCOUNTER — Ambulatory Visit: Payer: 59 | Admitting: Orthopedic Surgery

## 2019-04-10 DIAGNOSIS — M79672 Pain in left foot: Secondary | ICD-10-CM | POA: Diagnosis not present

## 2019-04-10 DIAGNOSIS — M25562 Pain in left knee: Secondary | ICD-10-CM | POA: Diagnosis not present

## 2019-08-01 DIAGNOSIS — Z01419 Encounter for gynecological examination (general) (routine) without abnormal findings: Secondary | ICD-10-CM | POA: Diagnosis not present

## 2019-08-01 DIAGNOSIS — Z6824 Body mass index (BMI) 24.0-24.9, adult: Secondary | ICD-10-CM | POA: Diagnosis not present

## 2019-08-07 DIAGNOSIS — H5213 Myopia, bilateral: Secondary | ICD-10-CM | POA: Diagnosis not present

## 2020-01-08 DIAGNOSIS — S93411A Sprain of calcaneofibular ligament of right ankle, initial encounter: Secondary | ICD-10-CM | POA: Diagnosis not present

## 2020-01-08 DIAGNOSIS — M25571 Pain in right ankle and joints of right foot: Secondary | ICD-10-CM | POA: Diagnosis not present

## 2020-02-12 DIAGNOSIS — H5712 Ocular pain, left eye: Secondary | ICD-10-CM | POA: Diagnosis not present

## 2020-03-18 DIAGNOSIS — Z113 Encounter for screening for infections with a predominantly sexual mode of transmission: Secondary | ICD-10-CM | POA: Diagnosis not present

## 2020-03-26 DIAGNOSIS — F4320 Adjustment disorder, unspecified: Secondary | ICD-10-CM | POA: Diagnosis not present

## 2020-03-26 DIAGNOSIS — Z23 Encounter for immunization: Secondary | ICD-10-CM | POA: Diagnosis not present

## 2020-03-26 DIAGNOSIS — E559 Vitamin D deficiency, unspecified: Secondary | ICD-10-CM | POA: Diagnosis not present

## 2020-03-26 DIAGNOSIS — Z87442 Personal history of urinary calculi: Secondary | ICD-10-CM | POA: Diagnosis not present

## 2020-03-26 DIAGNOSIS — R946 Abnormal results of thyroid function studies: Secondary | ICD-10-CM | POA: Diagnosis not present

## 2020-03-26 DIAGNOSIS — R7989 Other specified abnormal findings of blood chemistry: Secondary | ICD-10-CM | POA: Diagnosis not present

## 2020-03-26 DIAGNOSIS — Z0000 Encounter for general adult medical examination without abnormal findings: Secondary | ICD-10-CM | POA: Diagnosis not present

## 2020-04-24 ENCOUNTER — Other Ambulatory Visit (HOSPITAL_COMMUNITY): Payer: Self-pay | Admitting: Internal Medicine

## 2020-04-25 MED FILL — SERTRALINE HCL 50 MG TABLET: 50 | 90 days supply | Qty: 90 | Fill #0

## 2020-04-26 DIAGNOSIS — Z3040 Encounter for surveillance of contraceptives, unspecified: Secondary | ICD-10-CM | POA: Diagnosis not present

## 2020-04-26 DIAGNOSIS — Z3009 Encounter for other general counseling and advice on contraception: Secondary | ICD-10-CM | POA: Diagnosis not present

## 2020-04-26 DIAGNOSIS — N912 Amenorrhea, unspecified: Secondary | ICD-10-CM | POA: Diagnosis not present

## 2020-04-30 ENCOUNTER — Other Ambulatory Visit: Payer: Self-pay | Admitting: Radiology

## 2020-04-30 DIAGNOSIS — N6320 Unspecified lump in the left breast, unspecified quadrant: Secondary | ICD-10-CM | POA: Diagnosis not present

## 2020-04-30 DIAGNOSIS — N6322 Unspecified lump in the left breast, upper inner quadrant: Secondary | ICD-10-CM

## 2020-05-01 ENCOUNTER — Other Ambulatory Visit: Payer: Self-pay | Admitting: Radiology

## 2020-05-01 ENCOUNTER — Ambulatory Visit
Admission: RE | Admit: 2020-05-01 | Discharge: 2020-05-01 | Disposition: A | Discharge status: Home / Self Care | Payer: 59 | Source: Ambulatory Visit | Attending: Radiology | Admitting: Radiology

## 2020-05-01 ENCOUNTER — Other Ambulatory Visit: Payer: Self-pay

## 2020-05-01 DIAGNOSIS — R921 Mammographic calcification found on diagnostic imaging of breast: Secondary | ICD-10-CM

## 2020-05-01 DIAGNOSIS — N6489 Other specified disorders of breast: Secondary | ICD-10-CM | POA: Diagnosis not present

## 2020-05-01 DIAGNOSIS — N6310 Unspecified lump in the right breast, unspecified quadrant: Secondary | ICD-10-CM

## 2020-05-01 DIAGNOSIS — N6322 Unspecified lump in the left breast, upper inner quadrant: Secondary | ICD-10-CM

## 2020-06-24 DIAGNOSIS — J0100 Acute maxillary sinusitis, unspecified: Secondary | ICD-10-CM | POA: Diagnosis not present

## 2020-08-08 IMAGING — US US BREAST*L* LIMITED INC AXILLA
1 series · 2 of 2 positions shown · non-contrast
Comparison: Baseline evaluation

CLINICAL DATA: Palpable abnormality in the LEFT breast first noted
1 week ago. Mass is smaller today.

EXAM:
DIGITAL DIAGNOSTIC BILATERAL MAMMOGRAM WITH CAD AND TOMO
ULTRASOUND BILATERAL BREAST

[Series 1: us breast*left* limited inc axilla · 0.06mm/px · 2 of 2 slices shown]
[im 1/2]
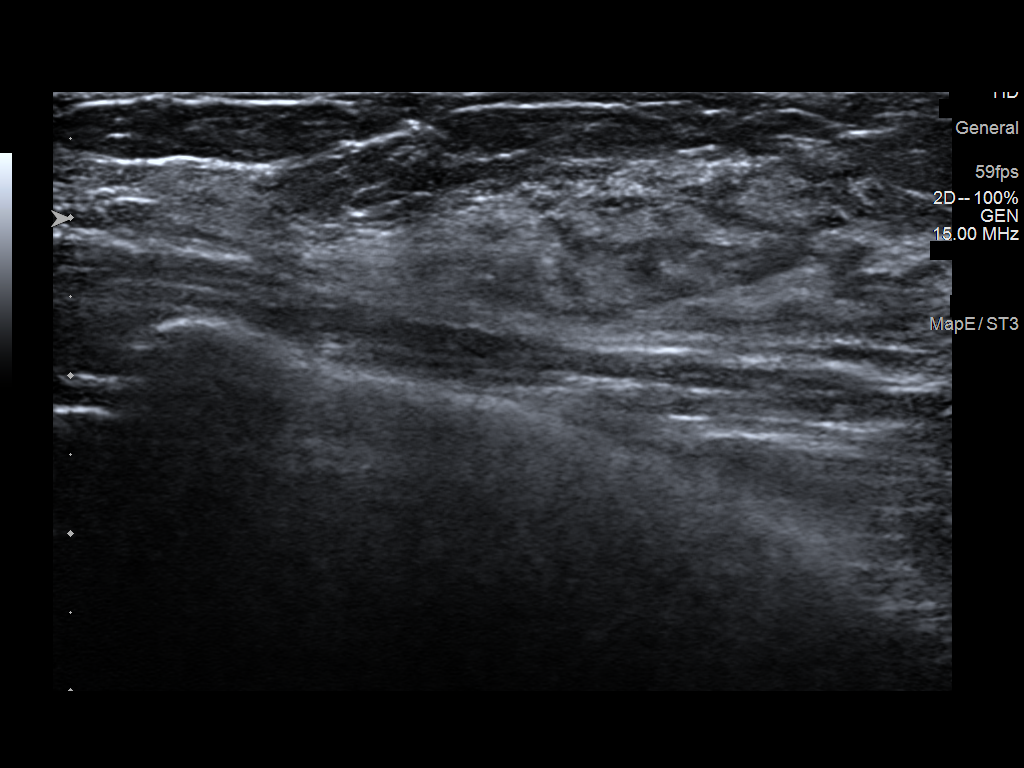
[im 2/2]
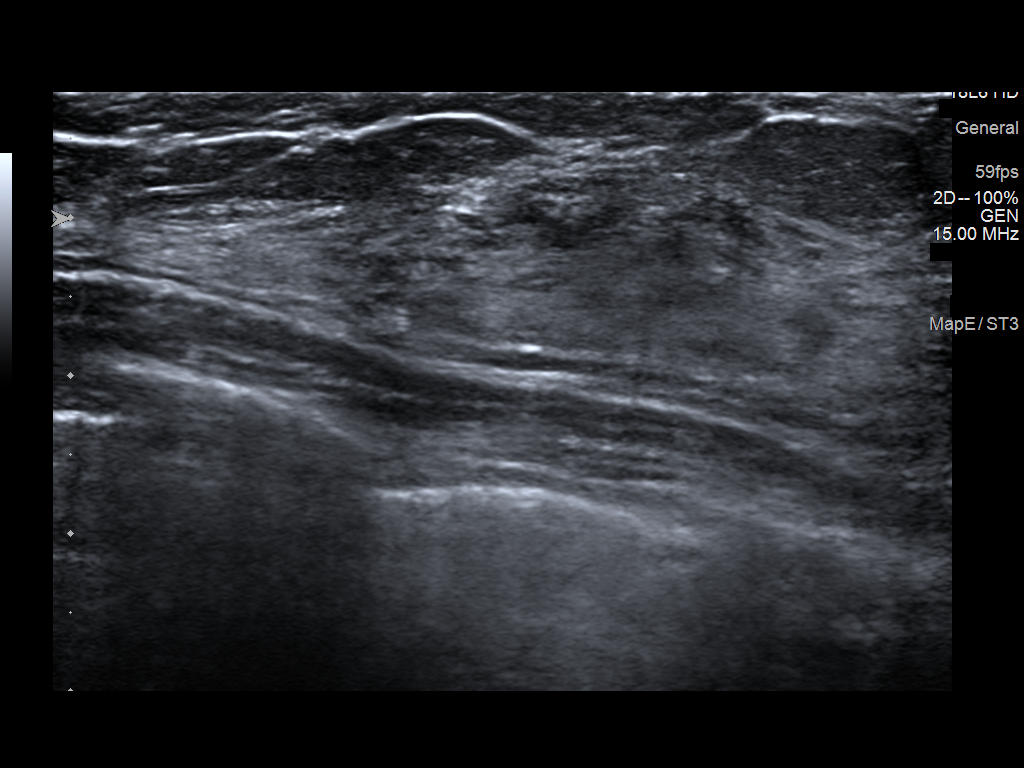

[2 of 2 positions shown; findings below may reference images not displayed]

ACR Breast Density Category d: The breast tissue is extremely dense,
which lowers the sensitivity of mammography.
FINDINGS: Right breast:

Mammogram: Magnified views are performed of calcifications in the
posterior central portion of the RIGHT breast. On magnified views,
there are diffuse punctate calcification in the central portion of
the RIGHT breast, not associated with morphology or distribution. A
possible oval mass in the UPPER-OUTER QUADRANT of the RIGHT breast
is confirmed on spot compression images.

Mammographic images were processed with CAD.

Ultrasound: Targeted ultrasound is performed, showing oval
circumscribed hypoechoic mass with hyperechoic center in the 10
o'clock location of the RIGHT breast 7 centimeters from the nipple
which measures 0.6 x 0.5 x 0.2 centimeters. Findings are consistent
with benign intramammary lymph node.

Left breast:

Mammogram: Spot tangential view is performed in the area of
patient's concern. These fusion normal appearing fibroglandular
tissue without mass. Mammographic images were processed with CAD.

Ultrasound: Targeted ultrasound is performed, showing normal
appearing fibroglandular tissue in UPPER INNER QUADRANT of the LEFT
breast. No suspicious mass, distortion, or acoustic shadowing is
demonstrated with ultrasound.
IMPRESSION: 1. No palpable abnormality in the area of patient's concern in the
LEFT breast.
2. Benign intramammary lymph node in the 10 o'clock location of the
RIGHT breast.
3. Probably benign calcifications in the central portion of the
RIGHT breast, warranting follow-up to document stability.

RECOMMENDATION:
RIGHT diagnostic mammogram in 6 months.

I have discussed the findings and recommendations with the patient.
If applicable, a reminder letter will be sent to the patient
regarding the next appointment.

BI-RADS CATEGORY  3: Probably benign.

## 2020-08-29 MED FILL — SERTRALINE HCL 50 MG TABLET: 50 | 90 days supply | Qty: 90 | Fill #1

## 2020-09-05 ENCOUNTER — Other Ambulatory Visit (HOSPITAL_COMMUNITY): Payer: Self-pay | Admitting: Internal Medicine

## 2020-09-05 DIAGNOSIS — N390 Urinary tract infection, site not specified: Secondary | ICD-10-CM | POA: Diagnosis not present

## 2020-09-05 DIAGNOSIS — R300 Dysuria: Secondary | ICD-10-CM | POA: Diagnosis not present

## 2020-09-05 DIAGNOSIS — L700 Acne vulgaris: Secondary | ICD-10-CM | POA: Diagnosis not present

## 2020-09-05 MED FILL — CIPROFLOXACIN HCL 250 MG TA: 250 | 3 days supply | Qty: 6 | Fill #0

## 2020-09-06 MED FILL — TRETINOIN 0.1% CREAM: 0.1 | 20 days supply | Qty: 20 | Fill #0

## 2020-09-16 DIAGNOSIS — Z760 Encounter for issue of repeat prescription: Secondary | ICD-10-CM | POA: Diagnosis not present

## 2020-09-16 DIAGNOSIS — Z6823 Body mass index (BMI) 23.0-23.9, adult: Secondary | ICD-10-CM | POA: Diagnosis not present

## 2020-09-16 DIAGNOSIS — Z01419 Encounter for gynecological examination (general) (routine) without abnormal findings: Secondary | ICD-10-CM | POA: Diagnosis not present

## 2020-09-16 DIAGNOSIS — Z113 Encounter for screening for infections with a predominantly sexual mode of transmission: Secondary | ICD-10-CM | POA: Diagnosis not present

## 2020-09-16 DIAGNOSIS — Z3040 Encounter for surveillance of contraceptives, unspecified: Secondary | ICD-10-CM | POA: Diagnosis not present

## 2020-09-16 MED FILL — NORLYDA 0.35 MG TABS: 0.35 | 28 days supply | Qty: 28 | Fill #0

## 2020-11-01 ENCOUNTER — Other Ambulatory Visit: Payer: Self-pay | Admitting: Radiology

## 2020-11-01 ENCOUNTER — Other Ambulatory Visit: Payer: Self-pay

## 2020-11-01 ENCOUNTER — Ambulatory Visit
Admission: RE | Admit: 2020-11-01 | Discharge: 2020-11-01 | Disposition: A | Discharge status: Home / Self Care | Payer: 59 | Source: Ambulatory Visit | Attending: Radiology | Admitting: Radiology

## 2020-11-01 DIAGNOSIS — R921 Mammographic calcification found on diagnostic imaging of breast: Secondary | ICD-10-CM

## 2020-11-18 ENCOUNTER — Other Ambulatory Visit: Payer: 59

## 2020-11-18 DIAGNOSIS — Z20822 Contact with and (suspected) exposure to covid-19: Secondary | ICD-10-CM

## 2020-11-19 ENCOUNTER — Telehealth: Payer: Self-pay | Admitting: *Deleted

## 2020-11-19 LAB — NOVEL CORONAVIRUS, NAA: SARS-CoV-2, NAA: DETECTED — AB

## 2020-11-19 LAB — SARS-COV-2, NAA 2 DAY TAT

## 2020-11-19 NOTE — Telephone Encounter (Signed)
Patient notified that COVID test results are still pending at this time. Understanding verbalized.

## 2020-12-06 ENCOUNTER — Telehealth: Payer: 59 | Admitting: Family

## 2020-12-06 DIAGNOSIS — R300 Dysuria: Secondary | ICD-10-CM | POA: Diagnosis not present

## 2020-12-06 MED ORDER — NITROFURANTOIN MONOHYD MACRO 100 MG PO CAPS: 100.0000 mg | ORAL_CAPSULE | Freq: Two times a day (BID) | ORAL | 0 refills | Status: DC

## 2020-12-06 NOTE — Progress Notes (Signed)

## 2021-01-07 MED FILL — SERTRALINE HCL 50 MG TABLET: 50 | 90 days supply | Qty: 90 | Fill #2

## 2021-01-08 ENCOUNTER — Other Ambulatory Visit (HOSPITAL_COMMUNITY): Payer: Self-pay | Admitting: Internal Medicine

## 2021-01-08 MED FILL — TRETINOIN 0.1% CREAM: 0.1 | 30 days supply | Qty: 20 | Fill #0

## 2021-02-08 IMAGING — MG MM DIGITAL DIAGNOSTIC UNILAT*R* W/ TOMO W/ CAD
9 series · 9 of 17 positions shown · non-contrast
Comparison: Previous exam(s).

CLINICAL DATA: 34-year-old female presenting for six-month
follow-up of probably benign right breast calcifications.

EXAM:
DIGITAL DIAGNOSTIC UNILATERAL RIGHT MAMMOGRAM WITH TOMO AND CAD

[R CC (1 of 3)]
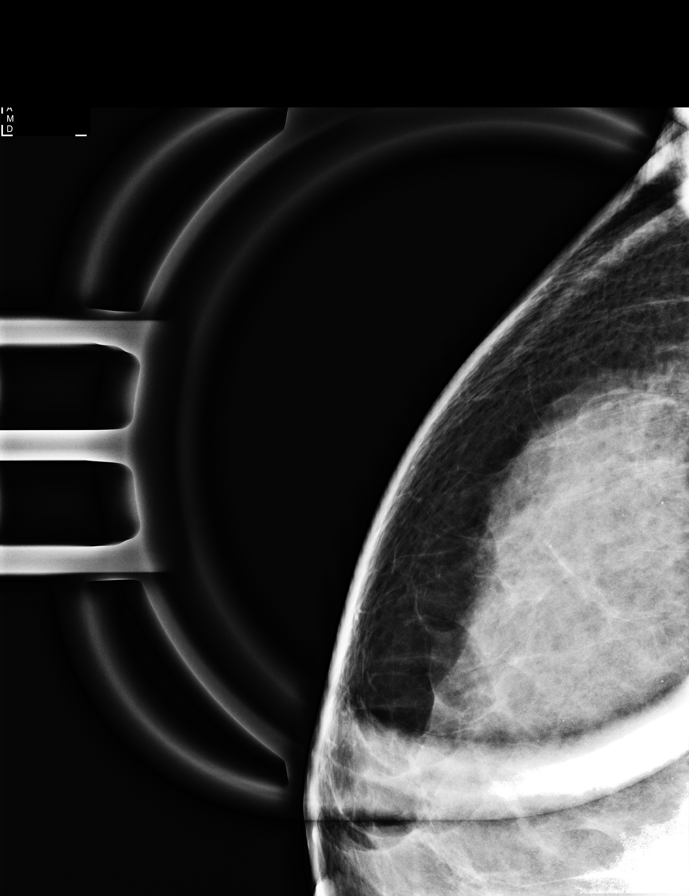

[R CC (2 of 3)]
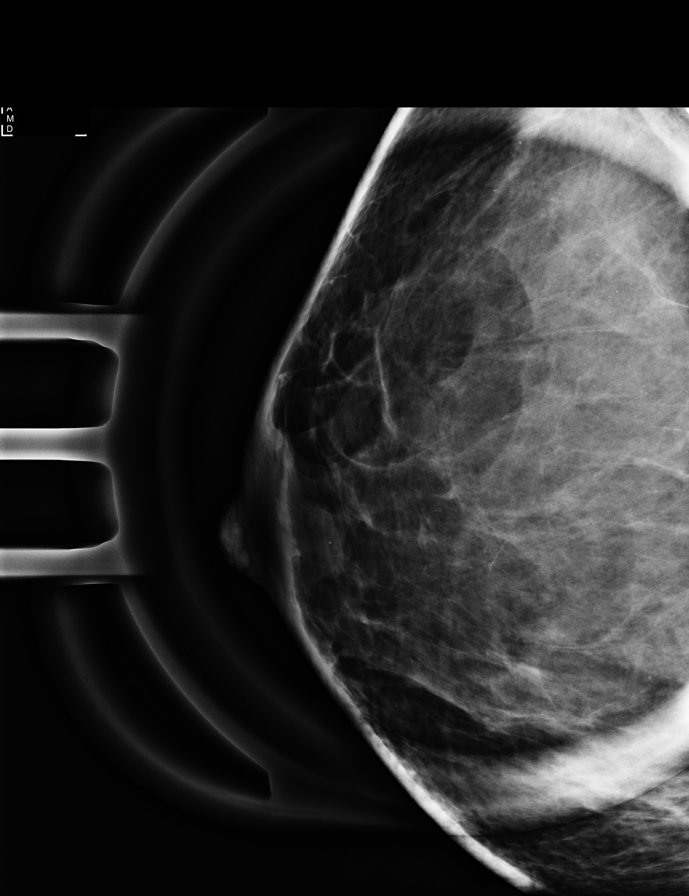

[R ML (1 of 2)]
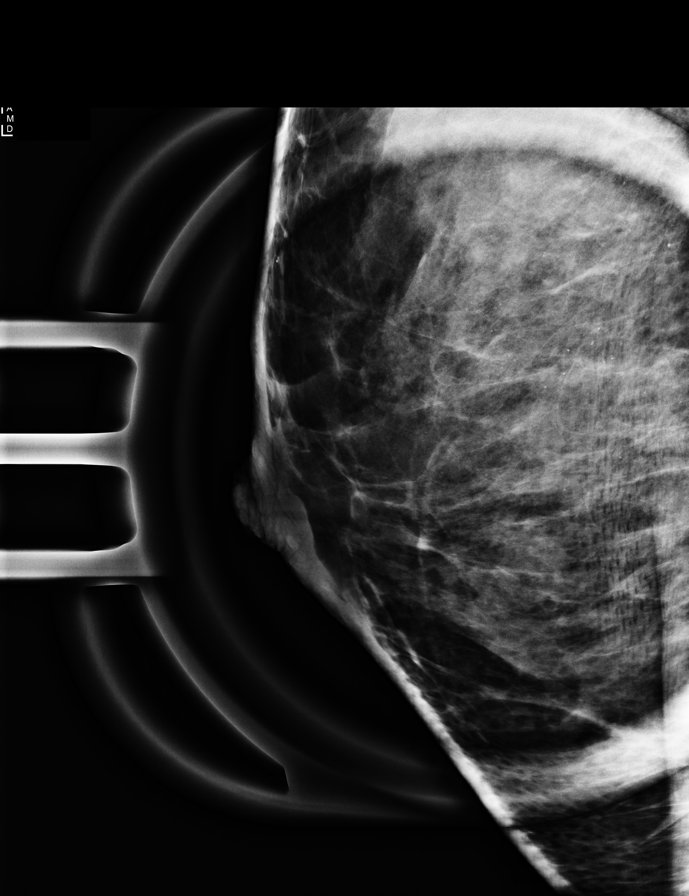

[R CC (3 of 3)]
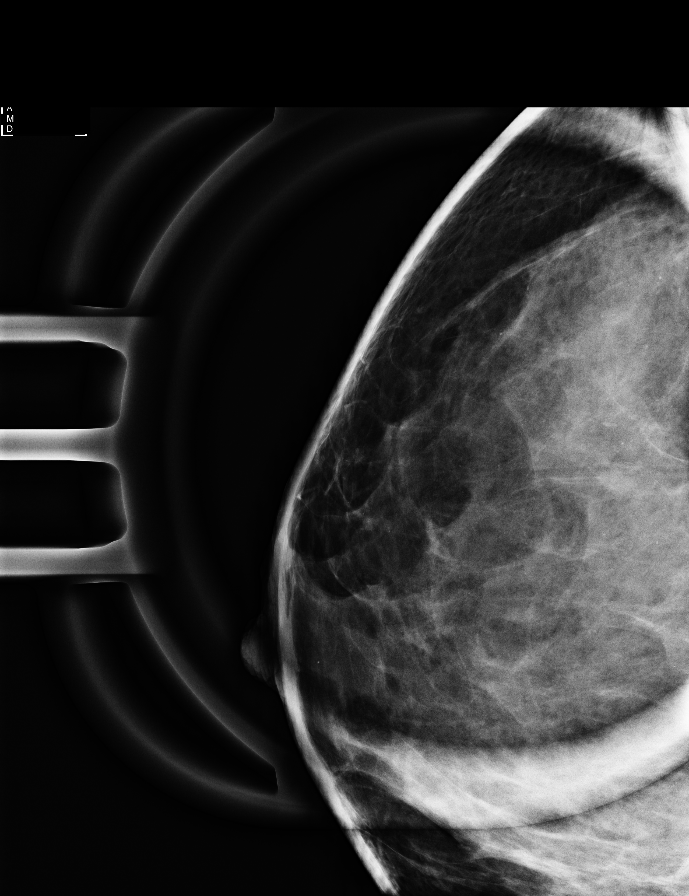

[R ML (2 of 2)]
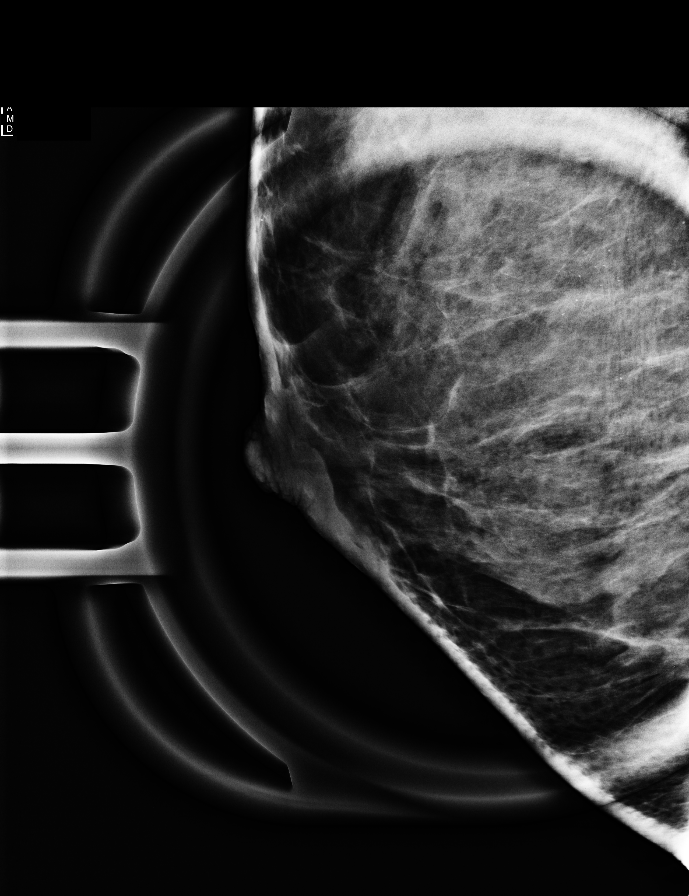

[R CC synth-2D]
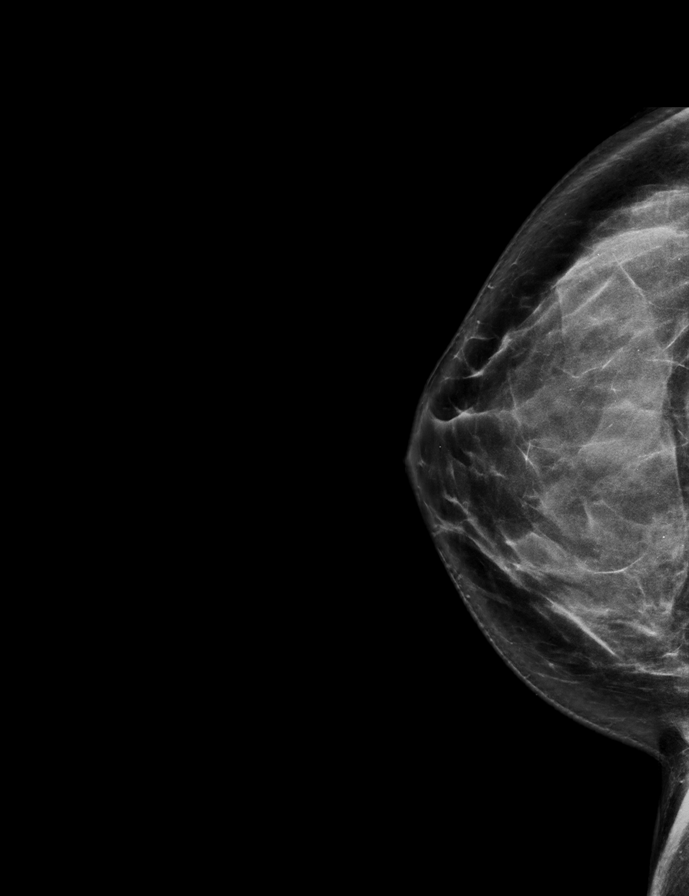

[R MLO synth-2D]
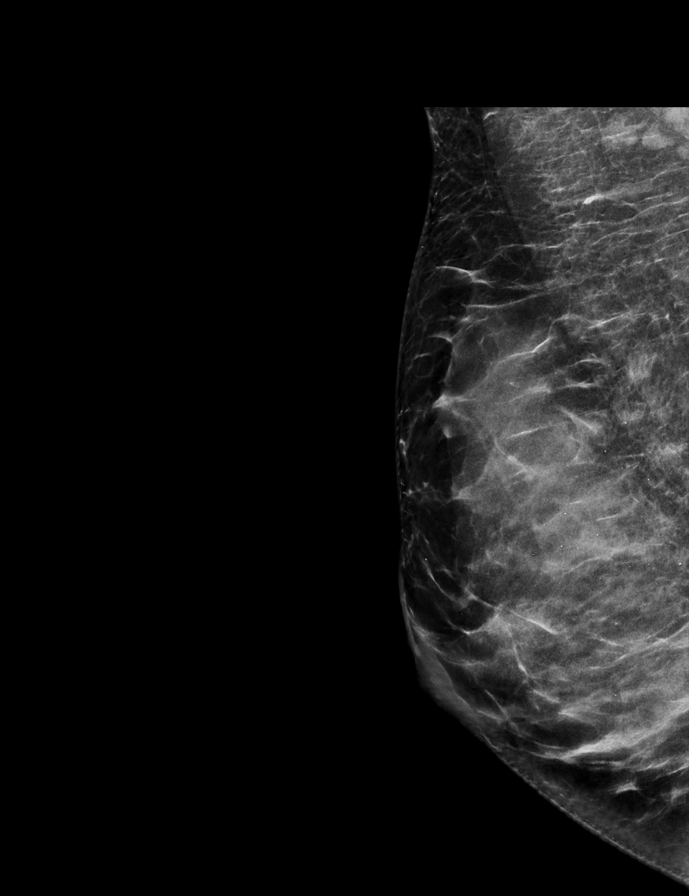

[R MLO tomo · tomo slice 32/63.0]
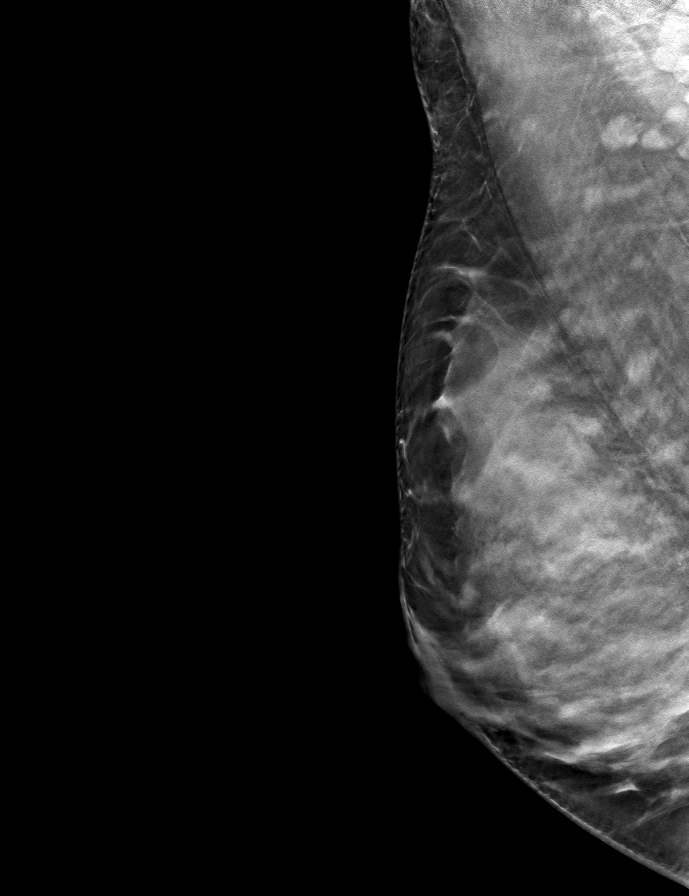

[R CC tomo · tomo slice 37/73.0]
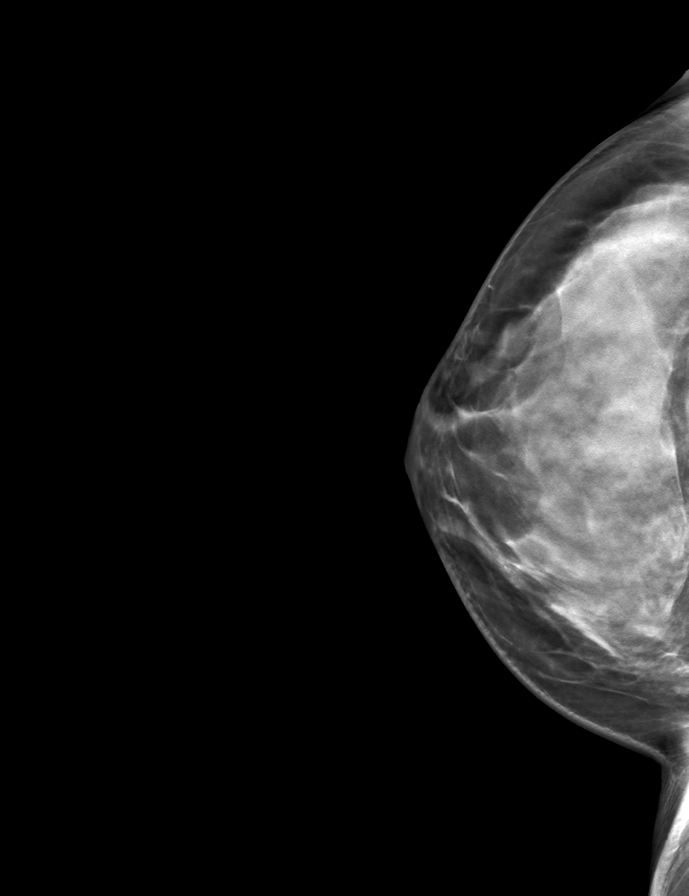

[9 of 17 positions shown; findings below may reference images not displayed]

ACR Breast Density Category d: The breast tissue is extremely dense,
which lowers the sensitivity of mammography.
FINDINGS: Loosely grouped punctate calcifications in the central right breast
are mammographically stable. No new or suspicious findings are
identified.

Mammographic images were processed with CAD.
IMPRESSION: Stable, probably benign right breast calcifications. Recommend
continued short-term imaging follow-up.

RECOMMENDATION:
Diagnostic right breast mammogram in 6 months.

I have discussed the findings and recommendations with the patient.
If applicable, a reminder letter will be sent to the patient
regarding the next appointment.

BI-RADS CATEGORY  3: Probably benign.

## 2021-03-15 ENCOUNTER — Other Ambulatory Visit (HOSPITAL_COMMUNITY): Payer: Self-pay

## 2021-05-05 ENCOUNTER — Other Ambulatory Visit (HOSPITAL_COMMUNITY): Payer: Self-pay

## 2021-05-06 ENCOUNTER — Other Ambulatory Visit (HOSPITAL_COMMUNITY): Payer: Self-pay

## 2021-05-06 DIAGNOSIS — E559 Vitamin D deficiency, unspecified: Secondary | ICD-10-CM | POA: Diagnosis not present

## 2021-05-06 DIAGNOSIS — Z87442 Personal history of urinary calculi: Secondary | ICD-10-CM | POA: Diagnosis not present

## 2021-05-06 DIAGNOSIS — N6019 Diffuse cystic mastopathy of unspecified breast: Secondary | ICD-10-CM | POA: Diagnosis not present

## 2021-05-06 DIAGNOSIS — R946 Abnormal results of thyroid function studies: Secondary | ICD-10-CM | POA: Diagnosis not present

## 2021-05-06 DIAGNOSIS — L700 Acne vulgaris: Secondary | ICD-10-CM | POA: Diagnosis not present

## 2021-05-06 DIAGNOSIS — F4320 Adjustment disorder, unspecified: Secondary | ICD-10-CM | POA: Diagnosis not present

## 2021-05-06 MED ORDER — SERTRALINE HCL 50 MG PO TABS
50.0000 mg | ORAL_TABLET | Freq: Every day | ORAL | 3 refills | Status: AC
  Filled 2021-05-06: qty 90, 90d supply, fill #0
  Filled 2021-08-06: qty 90, 90d supply, fill #1
  Filled 2021-10-30: qty 90, 90d supply, fill #2

## 2021-05-06 MED ORDER — TRETINOIN 0.1 % EX CREA
TOPICAL_CREAM | CUTANEOUS | 2 refills | Status: AC
Start: 2021-05-06 — End: ?
  Filled 2021-05-06: qty 20, 30d supply, fill #0
  Filled 2021-08-06: qty 20, 30d supply, fill #1

## 2021-05-07 ENCOUNTER — Other Ambulatory Visit (HOSPITAL_COMMUNITY): Payer: Self-pay

## 2021-05-15 ENCOUNTER — Other Ambulatory Visit: Payer: Self-pay | Admitting: Radiology

## 2021-05-15 ENCOUNTER — Other Ambulatory Visit: Payer: Self-pay

## 2021-05-15 ENCOUNTER — Ambulatory Visit
Admission: RE | Admit: 2021-05-15 | Discharge: 2021-05-15 | Disposition: A | Discharge status: Home / Self Care | Payer: 59 | Source: Ambulatory Visit | Attending: Radiology | Admitting: Radiology

## 2021-05-15 DIAGNOSIS — R921 Mammographic calcification found on diagnostic imaging of breast: Secondary | ICD-10-CM

## 2021-05-30 DIAGNOSIS — Z113 Encounter for screening for infections with a predominantly sexual mode of transmission: Secondary | ICD-10-CM | POA: Diagnosis not present

## 2021-05-30 DIAGNOSIS — N912 Amenorrhea, unspecified: Secondary | ICD-10-CM | POA: Diagnosis not present

## 2021-05-30 DIAGNOSIS — N760 Acute vaginitis: Secondary | ICD-10-CM | POA: Diagnosis not present

## 2021-05-30 DIAGNOSIS — Z3202 Encounter for pregnancy test, result negative: Secondary | ICD-10-CM | POA: Diagnosis not present

## 2021-08-04 ENCOUNTER — Other Ambulatory Visit (HOSPITAL_COMMUNITY): Payer: Self-pay

## 2021-08-06 ENCOUNTER — Other Ambulatory Visit (HOSPITAL_COMMUNITY): Payer: Self-pay

## 2021-08-08 ENCOUNTER — Other Ambulatory Visit (HOSPITAL_COMMUNITY): Payer: Self-pay

## 2021-10-30 ENCOUNTER — Other Ambulatory Visit (HOSPITAL_COMMUNITY): Payer: Self-pay

## 2021-11-11 DIAGNOSIS — H5213 Myopia, bilateral: Secondary | ICD-10-CM | POA: Diagnosis not present

## 2021-11-14 ENCOUNTER — Other Ambulatory Visit (HOSPITAL_COMMUNITY): Payer: Self-pay

## 2021-11-14 DIAGNOSIS — Z3202 Encounter for pregnancy test, result negative: Secondary | ICD-10-CM | POA: Diagnosis not present

## 2021-11-14 DIAGNOSIS — Z3040 Encounter for surveillance of contraceptives, unspecified: Secondary | ICD-10-CM | POA: Diagnosis not present

## 2021-11-14 DIAGNOSIS — Z6827 Body mass index (BMI) 27.0-27.9, adult: Secondary | ICD-10-CM | POA: Diagnosis not present

## 2021-11-14 DIAGNOSIS — Z01419 Encounter for gynecological examination (general) (routine) without abnormal findings: Secondary | ICD-10-CM | POA: Diagnosis not present

## 2021-11-14 DIAGNOSIS — Z113 Encounter for screening for infections with a predominantly sexual mode of transmission: Secondary | ICD-10-CM | POA: Diagnosis not present

## 2021-11-14 MED ORDER — NORETHINDRONE 0.35 MG PO TABS
1.0000 | ORAL_TABLET | Freq: Every day | ORAL | 11 refills | Status: AC
  Filled 2021-11-14 – 2021-11-28 (×2): qty 28, 28d supply, fill #0

## 2021-11-14 MED ORDER — SERTRALINE HCL 50 MG PO TABS
50.0000 mg | ORAL_TABLET | Freq: Every day | ORAL | 3 refills | Status: AC
  Filled 2021-11-14 – 2022-02-03 (×2): qty 90, 90d supply, fill #0
  Filled 2022-05-06: qty 90, 90d supply, fill #1
  Filled 2022-10-31: qty 90, 90d supply, fill #2

## 2021-11-28 ENCOUNTER — Other Ambulatory Visit (HOSPITAL_COMMUNITY): Payer: Self-pay

## 2022-02-03 ENCOUNTER — Other Ambulatory Visit (HOSPITAL_COMMUNITY): Payer: Self-pay

## 2022-04-21 DIAGNOSIS — F321 Major depressive disorder, single episode, moderate: Secondary | ICD-10-CM | POA: Diagnosis not present

## 2022-04-21 DIAGNOSIS — R110 Nausea: Secondary | ICD-10-CM | POA: Diagnosis not present

## 2022-04-21 DIAGNOSIS — F419 Anxiety disorder, unspecified: Secondary | ICD-10-CM | POA: Diagnosis not present

## 2022-04-28 ENCOUNTER — Other Ambulatory Visit: Payer: Self-pay | Admitting: Radiology

## 2022-04-28 DIAGNOSIS — R921 Mammographic calcification found on diagnostic imaging of breast: Secondary | ICD-10-CM

## 2022-04-29 ENCOUNTER — Other Ambulatory Visit (HOSPITAL_COMMUNITY): Payer: Self-pay

## 2022-04-29 DIAGNOSIS — F419 Anxiety disorder, unspecified: Secondary | ICD-10-CM | POA: Diagnosis not present

## 2022-04-29 DIAGNOSIS — F321 Major depressive disorder, single episode, moderate: Secondary | ICD-10-CM | POA: Diagnosis not present

## 2022-04-29 MED ORDER — BUSPIRONE HCL 7.5 MG PO TABS
7.5000 mg | ORAL_TABLET | Freq: Two times a day (BID) | ORAL | 2 refills | Status: DC | PRN
  Filled 2022-04-29: qty 60, 30d supply, fill #0

## 2022-05-06 ENCOUNTER — Other Ambulatory Visit (HOSPITAL_COMMUNITY): Payer: Self-pay

## 2022-05-18 ENCOUNTER — Ambulatory Visit
Admission: RE | Admit: 2022-05-18 | Discharge: 2022-05-18 | Disposition: A | Discharge status: Home / Self Care | Payer: 59 | Source: Ambulatory Visit | Attending: Radiology | Admitting: Radiology

## 2022-05-18 ENCOUNTER — Telehealth: Payer: Self-pay

## 2022-05-18 DIAGNOSIS — R921 Mammographic calcification found on diagnostic imaging of breast: Secondary | ICD-10-CM

## 2022-05-18 DIAGNOSIS — Z803 Family history of malignant neoplasm of breast: Secondary | ICD-10-CM

## 2022-05-18 NOTE — Telephone Encounter (Signed)
Referral placed in Epic.

## 2022-05-20 ENCOUNTER — Telehealth: Payer: Self-pay | Admitting: Licensed Clinical Social Worker

## 2022-05-20 NOTE — Telephone Encounter (Signed)
Scheduled appt per 6/26 referral. Pt is aware of appt date and time. Pt is aware to arrive 15 mins prior to appt time and to bring and updated insurance card. Pt is aware of appt location.   

## 2022-05-25 NOTE — Telephone Encounter (Signed)
Appt scheduled 07/06/22 at 1pm.

## 2022-06-15 DIAGNOSIS — M79644 Pain in right finger(s): Secondary | ICD-10-CM | POA: Diagnosis not present

## 2022-07-06 ENCOUNTER — Inpatient Hospital Stay: Payer: 59 | Attending: Internal Medicine | Admitting: Licensed Clinical Social Worker

## 2022-07-06 ENCOUNTER — Inpatient Hospital Stay: Payer: 59

## 2022-08-03 ENCOUNTER — Other Ambulatory Visit (HOSPITAL_COMMUNITY): Payer: Self-pay

## 2022-08-03 DIAGNOSIS — F321 Major depressive disorder, single episode, moderate: Secondary | ICD-10-CM | POA: Diagnosis not present

## 2022-08-03 DIAGNOSIS — F419 Anxiety disorder, unspecified: Secondary | ICD-10-CM | POA: Diagnosis not present

## 2022-08-03 MED ORDER — ALPRAZOLAM 0.25 MG PO TABS
0.2500 mg | ORAL_TABLET | Freq: Every day | ORAL | 0 refills | Status: DC | PRN
  Filled 2022-08-03: qty 14, 14d supply, fill #0

## 2022-08-03 MED ORDER — SERTRALINE HCL 50 MG PO TABS
50.0000 mg | ORAL_TABLET | Freq: Every day | ORAL | 1 refills | Status: AC
  Filled 2022-08-03: qty 90, 90d supply, fill #0

## 2022-09-19 ENCOUNTER — Other Ambulatory Visit (HOSPITAL_COMMUNITY): Payer: Self-pay

## 2022-09-19 MED ORDER — AMOXICILLIN 500 MG PO CAPS
500.0000 mg | ORAL_CAPSULE | Freq: Three times a day (TID) | ORAL | 0 refills | Status: DC
  Filled 2022-09-19: qty 21, 7d supply, fill #0

## 2022-09-21 ENCOUNTER — Other Ambulatory Visit (HOSPITAL_COMMUNITY): Payer: Self-pay

## 2022-10-09 DIAGNOSIS — B349 Viral infection, unspecified: Secondary | ICD-10-CM | POA: Diagnosis not present

## 2022-10-13 ENCOUNTER — Other Ambulatory Visit (HOSPITAL_COMMUNITY): Payer: Self-pay

## 2022-10-13 DIAGNOSIS — J069 Acute upper respiratory infection, unspecified: Secondary | ICD-10-CM | POA: Diagnosis not present

## 2022-10-13 MED ORDER — PREDNISONE 20 MG PO TABS
40.0000 mg | ORAL_TABLET | Freq: Every day | ORAL | 0 refills | Status: DC
  Filled 2022-10-13: qty 10, 5d supply, fill #0

## 2022-10-13 MED ORDER — AZITHROMYCIN 250 MG PO TABS
ORAL_TABLET | ORAL | 0 refills | Status: AC
  Filled 2022-10-13: qty 6, 5d supply, fill #0

## 2022-10-17 ENCOUNTER — Other Ambulatory Visit: Payer: Self-pay | Admitting: Cardiovascular Disease

## 2022-10-17 MED ORDER — ALBUTEROL SULFATE HFA 108 (90 BASE) MCG/ACT IN AERS: 2.0000 | INHALATION_SPRAY | Freq: Four times a day (QID) | RESPIRATORY_TRACT | 2 refills | Status: AC | PRN

## 2022-10-17 NOTE — Progress Notes (Signed)
Elizabeth Young has some cough, congestion,  Mild wheezing  Will prescribe Albuterol HFA .  She will call me for any palpitations     Kristeen Miss, MD  10/17/2022 11:06 AM    [facility] [address] [place],  [address] [place], [place]  [address] Phone: [phone]; Fax: [phone]

## 2022-11-02 ENCOUNTER — Other Ambulatory Visit: Payer: Self-pay

## 2022-11-03 DIAGNOSIS — H5213 Myopia, bilateral: Secondary | ICD-10-CM | POA: Diagnosis not present

## 2022-12-28 ENCOUNTER — Other Ambulatory Visit (HOSPITAL_COMMUNITY): Payer: Self-pay

## 2022-12-28 DIAGNOSIS — Z6825 Body mass index (BMI) 25.0-25.9, adult: Secondary | ICD-10-CM | POA: Diagnosis not present

## 2022-12-28 DIAGNOSIS — Z113 Encounter for screening for infections with a predominantly sexual mode of transmission: Secondary | ICD-10-CM | POA: Diagnosis not present

## 2022-12-28 DIAGNOSIS — Z760 Encounter for issue of repeat prescription: Secondary | ICD-10-CM | POA: Diagnosis not present

## 2022-12-28 DIAGNOSIS — F419 Anxiety disorder, unspecified: Secondary | ICD-10-CM | POA: Diagnosis not present

## 2022-12-28 DIAGNOSIS — Z124 Encounter for screening for malignant neoplasm of cervix: Secondary | ICD-10-CM | POA: Diagnosis not present

## 2022-12-28 DIAGNOSIS — Z01419 Encounter for gynecological examination (general) (routine) without abnormal findings: Secondary | ICD-10-CM | POA: Diagnosis not present

## 2022-12-28 MED ORDER — NORETHINDRONE 0.35 MG PO TABS
1.0000 | ORAL_TABLET | Freq: Every day | ORAL | 11 refills | Status: AC
  Filled 2022-12-28: qty 28, 28d supply, fill #0
  Filled 2023-08-10: qty 84, 84d supply, fill #0

## 2022-12-28 MED ORDER — ALPRAZOLAM 0.25 MG PO TABS
0.2500 mg | ORAL_TABLET | Freq: Every day | ORAL | 3 refills | Status: DC | PRN
  Filled 2022-12-28 – 2023-02-05 (×2): qty 30, 30d supply, fill #0

## 2022-12-28 MED ORDER — SERTRALINE HCL 50 MG PO TABS
50.0000 mg | ORAL_TABLET | Freq: Every day | ORAL | 3 refills | Status: AC
  Filled 2022-12-28 – 2023-02-05 (×2): qty 90, 90d supply, fill #0
  Filled 2023-05-11: qty 90, 90d supply, fill #1
  Filled 2023-08-10: qty 90, 90d supply, fill #2
  Filled 2023-11-12: qty 90, 90d supply, fill #3

## 2023-01-05 DIAGNOSIS — Z03818 Encounter for observation for suspected exposure to other biological agents ruled out: Secondary | ICD-10-CM | POA: Diagnosis not present

## 2023-01-05 DIAGNOSIS — J069 Acute upper respiratory infection, unspecified: Secondary | ICD-10-CM | POA: Diagnosis not present

## 2023-01-05 DIAGNOSIS — J020 Streptococcal pharyngitis: Secondary | ICD-10-CM | POA: Diagnosis not present

## 2023-01-11 ENCOUNTER — Other Ambulatory Visit (HOSPITAL_COMMUNITY): Payer: Self-pay

## 2023-02-05 ENCOUNTER — Other Ambulatory Visit (HOSPITAL_COMMUNITY): Payer: Self-pay

## 2023-02-26 DIAGNOSIS — R6889 Other general symptoms and signs: Secondary | ICD-10-CM | POA: Diagnosis not present

## 2023-02-26 DIAGNOSIS — J069 Acute upper respiratory infection, unspecified: Secondary | ICD-10-CM | POA: Diagnosis not present

## 2023-02-26 DIAGNOSIS — Z03818 Encounter for observation for suspected exposure to other biological agents ruled out: Secondary | ICD-10-CM | POA: Diagnosis not present

## 2023-03-04 ENCOUNTER — Other Ambulatory Visit (HOSPITAL_COMMUNITY): Payer: Self-pay

## 2023-03-04 ENCOUNTER — Telehealth: Payer: Commercial Managed Care - PPO | Admitting: Physician Assistant

## 2023-03-04 DIAGNOSIS — R3989 Other symptoms and signs involving the genitourinary system: Secondary | ICD-10-CM | POA: Diagnosis not present

## 2023-03-04 MED ORDER — FLUCONAZOLE 150 MG PO TABS
ORAL_TABLET | ORAL | 0 refills | Status: AC
  Filled 2023-03-04: qty 2, 3d supply, fill #0

## 2023-03-04 MED ORDER — CIPROFLOXACIN HCL 500 MG PO TABS
500.0000 mg | ORAL_TABLET | Freq: Two times a day (BID) | ORAL | 0 refills | Status: AC
  Filled 2023-03-04: qty 10, 5d supply, fill #0

## 2023-03-04 NOTE — Patient Instructions (Signed)
[place], thank you for joining Piedad Climes, PA-C for today's virtual visit.  While this provider is not your primary care provider (PCP), if your PCP is located in our provider database this encounter information will be shared with them immediately following your visit.   A [facility] [facility] MyChart account gives you access to today's visit and all your visits, tests, and labs performed at [facility] " click here if you don't have a [facility] MyChart account or go to [email or web address]  Consent: (Patient) Elizabeth Young provided verbal consent for this virtual visit at the beginning of the encounter.  Current Medications:  Current Outpatient Medications:    acetaminophen (TYLENOL) 500 MG tablet, Take 500 mg by mouth every 6 (six) hours as needed., Disp: , Rfl:    albuterol (VENTOLIN HFA) 108 (90 Base) MCG/ACT inhaler, Inhale 2 puffs into the lungs every 6 (six) hours as needed for wheezing or shortness of breath., Disp: 8 g, Rfl: 2   ALPRAZolam (XANAX) 0.25 MG tablet, Take 1 tablet (0.25 mg total) by mouth daily as needed., Disp: 14 tablet, Rfl: 0   ALPRAZolam (XANAX) 0.25 MG tablet, Take 1 tablet (0.25 mg total)  as needed., Disp: 30 tablet, Rfl: 3   amoxicillin (AMOXIL) 500 MG capsule, Take 1 capsule (500 mg total) by mouth every 8 (eight) hours., Disp: 21 capsule, Rfl: 0   busPIRone (BUSPAR) 7.5 MG tablet, Take 1 tablet (7.5 mg total) by mouth 2 (two) times daily as needed for anxiety, Disp: 60 tablet, Rfl: 2   cetirizine (ZYRTEC) 10 MG tablet, Take 10 mg by mouth as needed for allergies., Disp: , Rfl:    Fe Cbn-Fe Gluc-FA-B12-C-DSS (FERRALET 90) 90-1 MG TABS, Take 1 tablet by mouth daily., Disp: , Rfl: 2   HYDROcodone-acetaminophen (NORCO/VICODIN) 5-325 MG tablet, Take 1-2 tablets by mouth every 4 (four) hours as needed for severe pain. (Patient not taking: Reported on 01/01/2019), Disp: 30 tablet, Rfl: 0   ibuprofen (ADVIL,MOTRIN) 600 MG tablet, Take 1 tablet  (600 mg total) by mouth every 6 (six) hours. (Patient not taking: Reported on 01/01/2019), Disp: 30 tablet, Rfl: 0   nitrofurantoin, macrocrystal-monohydrate, (MACROBID) 100 MG capsule, Take 1 capsule (100 mg total) by mouth 2 (two) times daily., Disp: 10 capsule, Rfl: 0   norethindrone (MICRONOR) 0.35 MG tablet, Take 1 tablet (0.35 mg total) by mouth daily., Disp: 28 tablet, Rfl: 11   norethindrone (MICRONOR) 0.35 MG tablet, TAKE 1 TABLET BY MOUTH EVERY DAY, Disp: 28 tablet, Rfl: 11   predniSONE (DELTASONE) 20 MG tablet, Take 2 tablets (40 mg total) by mouth daily for 5 days, Disp: 10 tablet, Rfl: 0   Prenatal MV & Min w/FA-DHA (PRENATAL ADULT GUMMY/DHA/FA) 0.4-25 MG CHEW, Chew 2 tablets by mouth daily., Disp: , Rfl:    ranitidine (ZANTAC) 150 MG tablet, Take 1 tablet by mouth 2 (two) times daily as needed., Disp: , Rfl: 3   sertraline (ZOLOFT) 50 MG tablet, Take 50 mg by mouth daily., Disp: , Rfl: 3   sertraline (ZOLOFT) 50 MG tablet, TAKE 1 TABLET BY MOUTH ONCE DAILY, Disp: 90 tablet, Rfl: 2   sertraline (ZOLOFT) 50 MG tablet, Take 1 tablet (50 mg total) by mouth daily., Disp: 90 tablet, Rfl: 3   sertraline (ZOLOFT) 50 MG tablet, Take 1 tablet (50 mg total) by mouth daily., Disp: 90 tablet, Rfl: 3   sertraline (ZOLOFT) 50 MG tablet, Take 1 tablet (50 mg total) by mouth daily., Disp: 90 tablet, Rfl:  1   sertraline (ZOLOFT) 50 MG tablet, Take 1 tablet (50 mg total) by mouth daily., Disp: 90 tablet, Rfl: 3   tretinoin (RETIN-A) 0.1 % cream, apply topically to the affected area on face once daily in the evening., Disp: 20 g, Rfl: 2   Medications ordered in this encounter:  No orders of the defined types were placed in this encounter.    *If you need refills on other medications prior to your next appointment, please contact your pharmacy*  Follow-Up: Call back or seek an in-person evaluation if the symptoms worsen or if the condition fails to improve as anticipated.  [facility]  [phone]  Other Instructions Your symptoms are consistent with a bladder infection, also called acute cystitis. Please take your antibiotic (Cipro) as directed until all pills are gone.  Stay very well hydrated.  Consider a daily probiotic (Align, Culturelle, or Activia) to help prevent stomach upset caused by the antibiotic.  Taking a probiotic daily may also help prevent recurrent UTIs.  Also consider taking AZO (Phenazopyridine) tablets to help decrease pain with urination.     Urinary Tract Infection A urinary tract infection (UTI) can occur any place along the urinary tract. The tract includes the kidneys, ureters, bladder, and urethra. A type of germ called bacteria often causes a UTI. UTIs are often helped with antibiotic medicine.  HOME CARE  If given, take antibiotics as told by your doctor. Finish them even if you start to feel better. Drink enough fluids to keep your pee (urine) clear or pale yellow. Avoid tea, drinks with caffeine, and bubbly (carbonated) drinks. Pee often. Avoid holding your pee in for a long time. Pee before and after having sex (intercourse). Wipe from front to back after you poop (bowel movement) if you are a woman. Use each tissue only once. GET HELP RIGHT AWAY IF:  You have back pain. You have lower belly (abdominal) pain. You have chills. You feel sick to your stomach (nauseous). You throw up (vomit). Your burning or discomfort with peeing does not go away. You have a fever. Your symptoms are not better in 3 days. MAKE SURE YOU:  Understand these instructions. Will watch your condition. Will get help right away if you are not doing well or get worse. Document Released: 04/27/2008 Document Revised: 08/03/2012 Document Reviewed: 06/09/2012 [facility] Patient Information 2015 [place], [place]. This information is not intended to replace advice given to you by your health care provider. Make sure you discuss any questions you have with your health care  provider.    If you have been instructed to have an in-person evaluation today at a local Urgent Care facility, please use the link below. It will take you to a list of all of our available [facility], including address, phone number and hours of operation. Please do not delay care.  [facility]  If you or a family member do not have a primary care provider, use the link below to schedule a visit and establish care. When you choose a [facility] primary care physician or advanced practice provider, you gain a long-term partner in health. Find a Primary Care Provider  Learn more about [facility]'s in-office and virtual care options: [facility] - Get Care Now

## 2023-03-04 NOTE — Progress Notes (Signed)
Virtual Visit Consent   Elizabeth Young, you are scheduled for a virtual visit with a [facility] provider today. Just as with appointments in the office, your consent must be obtained to participate. Your consent will be active for this visit and any virtual visit you may have with one of our providers in the next 365 days. If you have a MyChart account, a copy of this consent can be sent to you electronically.  As this is a virtual visit, video technology does not allow for your provider to perform a traditional examination. This may limit your provider's ability to fully assess your condition. If your provider identifies any concerns that need to be evaluated in person or the need to arrange testing (such as labs, EKG, etc.), we will make arrangements to do so. Although advances in technology are sophisticated, we cannot ensure that it will always work on either your end or our end. If the connection with a video visit is poor, the visit may have to be switched to a telephone visit. With either a video or telephone visit, we are not always able to ensure that we have a secure connection.  By engaging in this virtual visit, you consent to the provision of healthcare and authorize for your insurance to be billed (if applicable) for the services provided during this visit. Depending on your insurance coverage, you may receive a charge related to this service.  I need to obtain your verbal consent now. Are you willing to proceed with your visit today? Elizabeth Young has provided verbal consent on 03/04/2023 for a virtual visit (video or telephone). Piedad Climes, [place]  Date: 03/04/2023 11:08 AM  Virtual Visit via Video Note   I, Piedad Climes, connected with  Elizabeth Young  ([id number], 1985-12-26) on 03/04/23 at 11:00 AM EDT by a video-enabled telemedicine application and verified that I am speaking with the correct person using two identifiers.  Location: Patient: Virtual Visit Location  Patient: Home Provider: Virtual Visit Location Provider: Home Office   I discussed the limitations of evaluation and management by telemedicine and the availability of in person appointments. The patient expressed understanding and agreed to proceed.    History of Present Illness: Elizabeth Young is a 37 y.o. who identifies as a female who was assigned female at birth, and is being seen today for concern for UTI. Dysuria, urinary urgency and frequency after intercourse with her partner. Notes malodorous urine. Denies fever, chills, vomiting, back pain, bell pain. Denies vaginal discharge or concern for STI.  Problems:  Patient Active Problem List   Diagnosis Date Noted   S/P cesarean section 04/09/2017   Pregnancy 04/08/2017   Heart palpitations 09/03/2015    Allergies:  Allergies  Allergen Reactions   Sulfamethoxazole-Trimethoprim Hives, Swelling and Anaphylaxis   Sulfa Antibiotics    Medications:  Current Outpatient Medications:    ciprofloxacin (CIPRO) 500 MG tablet, Take 1 tablet (500 mg total) by mouth 2 (two) times daily for 5 days., Disp: 10 tablet, Rfl: 0   fluconazole (DIFLUCAN) 150 MG tablet, Take 1 tablet PO once. Repeat in 3 days if needed., Disp: 2 tablet, Rfl: 0   acetaminophen (TYLENOL) 500 MG tablet, Take 500 mg by mouth every 6 (six) hours as needed., Disp: , Rfl:    albuterol (VENTOLIN HFA) 108 (90 Base) MCG/ACT inhaler, Inhale 2 puffs into the lungs every 6 (six) hours as needed for wheezing or shortness of breath., Disp: 8 g, Rfl: 2  cetirizine (ZYRTEC) 10 MG tablet, Take 10 mg by mouth as needed for allergies., Disp: , Rfl:    Fe Cbn-Fe Gluc-FA-B12-C-DSS (FERRALET 90) 90-1 MG TABS, Take 1 tablet by mouth daily., Disp: , Rfl: 2   HYDROcodone-acetaminophen (NORCO/VICODIN) 5-325 MG tablet, Take 1-2 tablets by mouth every 4 (four) hours as needed for severe pain. (Patient not taking: Reported on 01/01/2019), Disp: 30 tablet, Rfl: 0   ibuprofen (ADVIL,MOTRIN) 600 MG  tablet, Take 1 tablet (600 mg total) by mouth every 6 (six) hours. (Patient not taking: Reported on 01/01/2019), Disp: 30 tablet, Rfl: 0   norethindrone (MICRONOR) 0.35 MG tablet, Take 1 tablet (0.35 mg total) by mouth daily., Disp: 28 tablet, Rfl: 11   norethindrone (MICRONOR) 0.35 MG tablet, TAKE 1 TABLET BY MOUTH EVERY DAY, Disp: 28 tablet, Rfl: 11   Prenatal MV & Min w/FA-DHA (PRENATAL ADULT GUMMY/DHA/FA) 0.4-25 MG CHEW, Chew 2 tablets by mouth daily., Disp: , Rfl:    sertraline (ZOLOFT) 50 MG tablet, Take 50 mg by mouth daily., Disp: , Rfl: 3   sertraline (ZOLOFT) 50 MG tablet, TAKE 1 TABLET BY MOUTH ONCE DAILY, Disp: 90 tablet, Rfl: 2   sertraline (ZOLOFT) 50 MG tablet, Take 1 tablet (50 mg total) by mouth daily., Disp: 90 tablet, Rfl: 3   sertraline (ZOLOFT) 50 MG tablet, Take 1 tablet (50 mg total) by mouth daily., Disp: 90 tablet, Rfl: 3   sertraline (ZOLOFT) 50 MG tablet, Take 1 tablet (50 mg total) by mouth daily., Disp: 90 tablet, Rfl: 1   sertraline (ZOLOFT) 50 MG tablet, Take 1 tablet (50 mg total) by mouth daily., Disp: 90 tablet, Rfl: 3   tretinoin (RETIN-A) 0.1 % cream, apply topically to the affected area on face once daily in the evening., Disp: 20 g, Rfl: 2  Observations/Objective: Patient is well-developed, well-nourished in no acute distress.  Resting comfortably at home.  Head is normocephalic, atraumatic.  No labored breathing. Speech is clear and coherent with logical content.  Patient is alert and oriented at baseline.   Assessment and Plan: 1. Suspected UTI - ciprofloxacin (CIPRO) 500 MG tablet; Take 1 tablet (500 mg total) by mouth 2 (two) times daily for 5 days.  Dispense: 10 tablet; Refill: 0  Classic UTI symptoms with absence of alarm signs or symptoms. Known history of UTI. Will treat empirically with Cipro as she has done well with this previously for suspected uncomplicated cystitis. Supportive measures and OTC medications reviewed. Strict in-person evaluation  precautions discussed.    Follow Up Instructions: I discussed the assessment and treatment plan with the patient. The patient was provided an opportunity to ask questions and all were answered. The patient agreed with the plan and demonstrated an understanding of the instructions.  A copy of instructions were sent to the patient via MyChart unless otherwise noted below.   The patient was advised to call back or seek an in-person evaluation if the symptoms worsen or if the condition fails to improve as anticipated.  Time:  I spent 10 minutes with the patient via telehealth technology discussing the above problems/concerns.    Piedad Climes, PA-C

## 2023-05-10 DIAGNOSIS — M545 Low back pain, unspecified: Secondary | ICD-10-CM | POA: Diagnosis not present

## 2023-05-11 ENCOUNTER — Other Ambulatory Visit (HOSPITAL_COMMUNITY): Payer: Self-pay

## 2023-05-31 DIAGNOSIS — M5416 Radiculopathy, lumbar region: Secondary | ICD-10-CM | POA: Diagnosis not present

## 2023-06-01 ENCOUNTER — Other Ambulatory Visit: Payer: Self-pay | Admitting: Obstetrics and Gynecology

## 2023-06-01 DIAGNOSIS — Z803 Family history of malignant neoplasm of breast: Secondary | ICD-10-CM

## 2023-06-01 DIAGNOSIS — Z1231 Encounter for screening mammogram for malignant neoplasm of breast: Secondary | ICD-10-CM

## 2023-06-02 ENCOUNTER — Ambulatory Visit: Payer: Commercial Managed Care - PPO

## 2023-06-02 DIAGNOSIS — Z1231 Encounter for screening mammogram for malignant neoplasm of breast: Secondary | ICD-10-CM

## 2023-06-02 DIAGNOSIS — M5416 Radiculopathy, lumbar region: Secondary | ICD-10-CM | POA: Diagnosis not present

## 2023-06-02 DIAGNOSIS — Z803 Family history of malignant neoplasm of breast: Secondary | ICD-10-CM

## 2023-06-11 ENCOUNTER — Telehealth: Payer: Commercial Managed Care - PPO | Admitting: Nurse Practitioner

## 2023-06-11 DIAGNOSIS — T3695XA Adverse effect of unspecified systemic antibiotic, initial encounter: Secondary | ICD-10-CM | POA: Diagnosis not present

## 2023-06-11 DIAGNOSIS — J029 Acute pharyngitis, unspecified: Secondary | ICD-10-CM | POA: Diagnosis not present

## 2023-06-11 DIAGNOSIS — B379 Candidiasis, unspecified: Secondary | ICD-10-CM

## 2023-06-11 DIAGNOSIS — Z20818 Contact with and (suspected) exposure to other bacterial communicable diseases: Secondary | ICD-10-CM | POA: Diagnosis not present

## 2023-06-11 MED ORDER — FLUCONAZOLE 150 MG PO TABS: ORAL_TABLET | ORAL | 0 refills | Status: AC

## 2023-06-11 MED ORDER — AMOXICILLIN 500 MG PO CAPS: 500.0000 mg | ORAL_CAPSULE | Freq: Two times a day (BID) | ORAL | 0 refills | Status: AC

## 2023-06-11 NOTE — Progress Notes (Signed)
Meds ordered this encounter  Medications   amoxicillin (AMOXIL) 500 MG capsule    Sig: Take 1 capsule (500 mg total) by mouth 2 (two) times daily for 10 days.    Dispense:  20 capsule    Refill:  0   fluconazole (DIFLUCAN) 150 MG tablet    Sig: May repeat after 72 hours if needed X1    Dispense:  2 tablet    Refill:  0

## 2023-06-11 NOTE — Addendum Note (Signed)
Addended by: Viviano Simas E on: 06/11/2023 07:31 PM   Modules accepted: Orders

## 2023-06-11 NOTE — Progress Notes (Signed)
E-Visit for Sore Throat - Strep Symptoms  We are sorry that you are not feeling well.  Here is how we plan to help!  Based on what you have shared with me it is likely that you have strep pharyngitis.  Strep pharyngitis is inflammation and infection in the back of the throat.  This is an infection cause by bacteria and is treated with antibiotics.  I have prescribed Amoxicillin 500 mg twice a day for 10 days. For throat pain, we recommend over the counter oral pain relief medications such as acetaminophen or aspirin, or anti-inflammatory medications such as ibuprofen or naproxen sodium. Topical treatments such as oral throat lozenges or sprays may be used as needed. Strep infections are not as easily transmitted as other respiratory infections, however we still recommend that you avoid close contact with loved ones, especially the very young and elderly.  Remember to wash your hands thoroughly throughout the day as this is the number one way to prevent the spread of infection and wipe down door knobs and counters with disinfectant.   Home Care: Only take medications as instructed by your medical team. Complete the entire course of an antibiotic. Do not take these medications with alcohol. A steam or ultrasonic humidifier can help congestion.  You can place a towel over your head and breathe in the steam from hot water coming from a faucet. Avoid close contacts especially the very young and the elderly. Cover your mouth when you cough or sneeze. Always remember to wash your hands.  Get Help Right Away If: You develop worsening fever or sinus pain. You develop a severe head ache or visual changes. Your symptoms persist after you have completed your treatment plan.  Make sure you Understand these instructions. Will watch your condition. Will get help right away if you are not doing well or get worse.   Thank you for choosing an e-visit.  Your e-visit answers were reviewed by a board  certified advanced clinical practitioner to complete your personal care plan. Depending upon the condition, your plan could have included both over the counter or prescription medications.  Please review your pharmacy choice. Make sure the pharmacy is open so you can pick up prescription now. If there is a problem, you may contact your provider through MyChart messaging and have the prescription routed to another pharmacy.  Your safety is important to us. If you have drug allergies check your prescription carefully.   For the next 24 hours you can use MyChart to ask questions about today's visit, request a non-urgent call back, or ask for a work or school excuse. You will get an email in the next two days asking about your experience. I hope that your e-visit has been valuable and will speed your recovery.   Meds ordered this encounter  Medications   amoxicillin (AMOXIL) 500 MG capsule    Sig: Take 1 capsule (500 mg total) by mouth 2 (two) times daily for 10 days.    Dispense:  20 capsule    Refill:  0     I spent approximately 5 minutes reviewing the patient's history, current symptoms and coordinating their care today.   

## 2023-07-14 DIAGNOSIS — R1909 Other intra-abdominal and pelvic swelling, mass and lump: Secondary | ICD-10-CM | POA: Diagnosis not present

## 2023-08-10 ENCOUNTER — Other Ambulatory Visit: Payer: Self-pay

## 2023-08-10 ENCOUNTER — Other Ambulatory Visit (HOSPITAL_COMMUNITY): Payer: Self-pay

## 2023-08-10 MED ORDER — ALPRAZOLAM 0.25 MG PO TABS
0.2500 mg | ORAL_TABLET | Freq: Every day | ORAL | 3 refills | Status: AC | PRN
  Filled 2023-08-10: qty 30, 30d supply, fill #0

## 2023-08-13 ENCOUNTER — Other Ambulatory Visit (HOSPITAL_COMMUNITY): Payer: Self-pay

## 2023-11-11 ENCOUNTER — Other Ambulatory Visit: Payer: Self-pay | Admitting: Cardiovascular Disease

## 2023-11-11 MED ORDER — ALBUTEROL SULFATE HFA 108 (90 BASE) MCG/ACT IN AERS: 2.0000 | INHALATION_SPRAY | Freq: Four times a day (QID) | RESPIRATORY_TRACT | 2 refills | Status: AC | PRN

## 2023-11-11 NOTE — Progress Notes (Signed)
URI Will send in albuterol HFL

## 2023-11-12 ENCOUNTER — Other Ambulatory Visit (HOSPITAL_COMMUNITY): Payer: Self-pay

## 2023-11-12 DIAGNOSIS — J329 Chronic sinusitis, unspecified: Secondary | ICD-10-CM | POA: Diagnosis not present

## 2023-11-12 DIAGNOSIS — J40 Bronchitis, not specified as acute or chronic: Secondary | ICD-10-CM | POA: Diagnosis not present

## 2023-11-12 DIAGNOSIS — Z2089 Contact with and (suspected) exposure to other communicable diseases: Secondary | ICD-10-CM | POA: Diagnosis not present

## 2023-11-12 DIAGNOSIS — J029 Acute pharyngitis, unspecified: Secondary | ICD-10-CM | POA: Diagnosis not present

## 2023-11-18 ENCOUNTER — Other Ambulatory Visit (HOSPITAL_COMMUNITY): Payer: Self-pay

## 2023-12-27 DIAGNOSIS — D492 Neoplasm of unspecified behavior of bone, soft tissue, and skin: Secondary | ICD-10-CM | POA: Diagnosis not present

## 2023-12-27 DIAGNOSIS — L814 Other melanin hyperpigmentation: Secondary | ICD-10-CM | POA: Diagnosis not present

## 2023-12-27 DIAGNOSIS — L821 Other seborrheic keratosis: Secondary | ICD-10-CM | POA: Diagnosis not present

## 2024-01-17 DIAGNOSIS — Z7189 Other specified counseling: Secondary | ICD-10-CM | POA: Diagnosis not present

## 2024-01-17 DIAGNOSIS — L814 Other melanin hyperpigmentation: Secondary | ICD-10-CM | POA: Diagnosis not present

## 2024-01-17 DIAGNOSIS — L813 Cafe au lait spots: Secondary | ICD-10-CM | POA: Diagnosis not present

## 2024-01-17 DIAGNOSIS — L700 Acne vulgaris: Secondary | ICD-10-CM | POA: Diagnosis not present

## 2024-01-17 DIAGNOSIS — D225 Melanocytic nevi of trunk: Secondary | ICD-10-CM | POA: Diagnosis not present

## 2024-01-17 DIAGNOSIS — L821 Other seborrheic keratosis: Secondary | ICD-10-CM | POA: Diagnosis not present

## 2024-01-25 DIAGNOSIS — Z6829 Body mass index (BMI) 29.0-29.9, adult: Secondary | ICD-10-CM | POA: Diagnosis not present

## 2024-01-25 DIAGNOSIS — R635 Abnormal weight gain: Secondary | ICD-10-CM | POA: Diagnosis not present

## 2024-01-25 DIAGNOSIS — Z124 Encounter for screening for malignant neoplasm of cervix: Secondary | ICD-10-CM | POA: Diagnosis not present

## 2024-01-25 DIAGNOSIS — Z01419 Encounter for gynecological examination (general) (routine) without abnormal findings: Secondary | ICD-10-CM | POA: Diagnosis not present

## 2024-04-19 ENCOUNTER — Institutional Professional Consult (permissible substitution): Payer: Self-pay | Admitting: Surgical

## 2024-05-30 DIAGNOSIS — M5459 Other low back pain: Secondary | ICD-10-CM | POA: Diagnosis not present

## 2024-06-06 ENCOUNTER — Encounter (HOSPITAL_COMMUNITY): Payer: Self-pay

## 2024-06-06 ENCOUNTER — Other Ambulatory Visit (HOSPITAL_COMMUNITY): Payer: Self-pay

## 2024-06-06 DIAGNOSIS — Z713 Dietary counseling and surveillance: Secondary | ICD-10-CM | POA: Diagnosis not present

## 2024-06-06 DIAGNOSIS — Z6828 Body mass index (BMI) 28.0-28.9, adult: Secondary | ICD-10-CM | POA: Diagnosis not present

## 2024-06-06 MED ORDER — ZEPBOUND 2.5 MG/0.5ML ~~LOC~~ SOAJ
2.5000 mg | SUBCUTANEOUS | 1 refills | Status: AC
  Filled 2024-06-06: qty 2, 28d supply, fill #0

## 2024-06-07 ENCOUNTER — Other Ambulatory Visit (HOSPITAL_COMMUNITY): Payer: Self-pay

## 2024-06-07 ENCOUNTER — Encounter (HOSPITAL_COMMUNITY): Payer: Self-pay

## 2024-06-08 ENCOUNTER — Encounter (HOSPITAL_COMMUNITY): Payer: Self-pay

## 2024-06-08 ENCOUNTER — Other Ambulatory Visit (HOSPITAL_COMMUNITY): Payer: Self-pay

## 2024-06-27 ENCOUNTER — Encounter: Payer: Self-pay | Admitting: Obstetrics and Gynecology

## 2024-07-05 ENCOUNTER — Other Ambulatory Visit: Payer: Self-pay

## 2024-07-17 DIAGNOSIS — O0281 Inappropriate change in quantitative human chorionic gonadotropin (hCG) in early pregnancy: Secondary | ICD-10-CM | POA: Diagnosis not present

## 2024-07-17 DIAGNOSIS — N912 Amenorrhea, unspecified: Secondary | ICD-10-CM | POA: Diagnosis not present

## 2024-07-19 DIAGNOSIS — N912 Amenorrhea, unspecified: Secondary | ICD-10-CM | POA: Diagnosis not present

## 2024-08-22 DIAGNOSIS — K0889 Other specified disorders of teeth and supporting structures: Secondary | ICD-10-CM | POA: Diagnosis not present
